# Patient Record
Sex: Female | Born: 1980
Health system: Southern US, Community
[De-identification: ages and names within clinical notes are randomized; demographics above are authoritative.]

## PROBLEM LIST (undated history)

## (undated) DIAGNOSIS — K30 Functional dyspepsia: Secondary | ICD-10-CM

## (undated) DIAGNOSIS — J45909 Unspecified asthma, uncomplicated: Secondary | ICD-10-CM

## (undated) DIAGNOSIS — E559 Vitamin D deficiency, unspecified: Secondary | ICD-10-CM

## (undated) DIAGNOSIS — S42309A Unspecified fracture of shaft of humerus, unspecified arm, initial encounter for closed fracture: Secondary | ICD-10-CM

## (undated) DIAGNOSIS — E039 Hypothyroidism, unspecified: Secondary | ICD-10-CM

## (undated) DIAGNOSIS — J329 Chronic sinusitis, unspecified: Secondary | ICD-10-CM

## (undated) DIAGNOSIS — J3089 Other allergic rhinitis: Secondary | ICD-10-CM

## (undated) DIAGNOSIS — M069 Rheumatoid arthritis, unspecified: Secondary | ICD-10-CM

## (undated) DIAGNOSIS — Z923 Personal history of irradiation: Secondary | ICD-10-CM

## (undated) DIAGNOSIS — S92909A Unspecified fracture of unspecified foot, initial encounter for closed fracture: Secondary | ICD-10-CM

## (undated) HISTORY — PX: ROOT CANAL: SHX2363

## (undated) HISTORY — PX: TOE SURGERY: SHX1073

## (undated) HISTORY — DX: Rheumatoid arthritis, unspecified: M06.9

---
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
## 2000-03-12 ENCOUNTER — Emergency Department (HOSPITAL_COMMUNITY): Admission: EM | Admit: 2000-03-12 | Discharge: 2000-03-12 | Payer: Self-pay | Admitting: Emergency Medicine

## 2000-07-07 ENCOUNTER — Emergency Department (HOSPITAL_COMMUNITY): Admission: EM | Admit: 2000-07-07 | Discharge: 2000-07-07 | Payer: Self-pay | Admitting: Emergency Medicine

## 2001-03-23 ENCOUNTER — Emergency Department (HOSPITAL_COMMUNITY): Admission: EM | Admit: 2001-03-23 | Discharge: 2001-03-23 | Payer: Self-pay | Admitting: Emergency Medicine

## 2001-03-23 ENCOUNTER — Encounter: Payer: Self-pay | Admitting: Emergency Medicine

## 2001-05-20 ENCOUNTER — Other Ambulatory Visit: Admission: RE | Admit: 2001-05-20 | Discharge: 2001-05-20 | Payer: Self-pay | Admitting: Family Medicine

## 2001-09-01 ENCOUNTER — Ambulatory Visit (HOSPITAL_COMMUNITY): Admission: RE | Admit: 2001-09-01 | Discharge: 2001-09-01 | Payer: Self-pay | Admitting: *Deleted

## 2001-09-01 ENCOUNTER — Encounter: Payer: Self-pay | Admitting: *Deleted

## 2001-10-22 ENCOUNTER — Inpatient Hospital Stay (HOSPITAL_COMMUNITY): Admission: AD | Admit: 2001-10-22 | Discharge: 2001-10-22 | Payer: Self-pay | Admitting: Obstetrics

## 2002-01-26 ENCOUNTER — Inpatient Hospital Stay (HOSPITAL_COMMUNITY): Admission: AD | Admit: 2002-01-26 | Discharge: 2002-01-28 | Payer: Self-pay | Admitting: Obstetrics and Gynecology

## 2002-03-05 ENCOUNTER — Emergency Department (HOSPITAL_COMMUNITY): Admission: EM | Admit: 2002-03-05 | Discharge: 2002-03-05 | Payer: Self-pay

## 2002-03-09 ENCOUNTER — Other Ambulatory Visit: Admission: RE | Admit: 2002-03-09 | Discharge: 2002-03-09 | Payer: Self-pay | Admitting: Obstetrics and Gynecology

## 2002-07-13 ENCOUNTER — Emergency Department (HOSPITAL_COMMUNITY): Admission: EM | Admit: 2002-07-13 | Discharge: 2002-07-13 | Payer: Self-pay | Admitting: Emergency Medicine

## 2002-12-23 ENCOUNTER — Encounter: Payer: Self-pay | Admitting: Family Medicine

## 2002-12-23 ENCOUNTER — Encounter: Admission: RE | Admit: 2002-12-23 | Discharge: 2002-12-23 | Payer: Self-pay | Admitting: Family Medicine

## 2006-06-19 ENCOUNTER — Emergency Department (HOSPITAL_COMMUNITY): Admission: EM | Admit: 2006-06-19 | Discharge: 2006-06-20 | Payer: Self-pay | Admitting: Emergency Medicine

## 2007-08-24 ENCOUNTER — Emergency Department (HOSPITAL_COMMUNITY): Admission: EM | Admit: 2007-08-24 | Discharge: 2007-08-24 | Payer: Self-pay | Admitting: Family Medicine

## 2010-08-18 ENCOUNTER — Ambulatory Visit (HOSPITAL_COMMUNITY)
Admission: RE | Admit: 2010-08-18 | Discharge: 2010-08-18 | Payer: Self-pay | Source: Home / Self Care | Attending: Nurse Practitioner | Admitting: Nurse Practitioner

## 2010-08-20 ENCOUNTER — Encounter: Payer: Self-pay | Admitting: Family Medicine

## 2010-12-15 NOTE — Discharge Summary (Signed)
[facility]  Patient:    Erika King, Erika King Visit Number: [id number] MRN: [id number]          Service Type: OBS Location: 910A [id number] [id number] Attending Physician:  Michaele Offer Dictated by:   Malachi Pro. Ambrose Mantle, M.D. Admit Date:  01/26/2002 Discharge Date: 01/28/2002                             Discharge Summary  HISTORY OF PRESENT ILLNESS:       The patient is a 30 year old black female para 0-0-1-0, gravida 2, [facility] January 29, 2002 by first-trimester ultrasound inconsistent with last menstrual period presented for induction of labor given very favorable cervix and term status.  Pregnancy complicated by family history of Huntingtons chorea, the patient tested negative; otherwise, uneventful prenatal course.  LABORATORY DATA:                  A positive blood group and type with a negative antibody, sickle cell negative, RPR nonreactive, rubella immune, hepatitis B surface antigen negative, HIV negative, triple screen normal, group B strep negative.  PAST OBSTETRICAL HISTORY:         Early abortion x1.  PAST MEDICAL HISTORY:             Negative.  PAST SURGICAL HISTORY:            Negative.  ALLERGIES:                        No known allergies.  HOSPITAL COURSE:                  On admission the patient was afebrile with normal vital signs.  Abdomen was consistent with a 7-pound infant.  Cervix was 3-4 cm, 80%, vertex, at -1.  Dr. Senaida Ores ruptured the membranes with clear fluid.  Pitocin was given.  The patient reached complete dilatation and pushed well with a spontaneous vaginal delivery of a vigorous female infant over an intact perineum.  The infant was 7 pounds 10 ounces.  Apgars of 9 at one and 9 at five minutes.  Placenta delivered spontaneously, three-vessel cord. Small periurethral laceration on the right was not repaired as it was hemostatic.  Blood loss about 350 cc.  Cervix and rectum were intact.  Blood pressures were  slightly elevated and they were followed; without treatment, they continued to be intermittently minimally elevated.  The patient had no headache or epigastric pain and she was considered ready for discharge on the second postpartum day.  DISCHARGE LABORATORY DATA:        Hemoglobin 10.9, hematocrit 32.6, white count 8100, platelet count 180,000.  Metabolic profile showed liver enzymes to be normal.  AST 25, ALT 15, bilirubin was 0.3, uric acid 5.2, RPR was nonreactive.  FINAL DIAGNOSIS:                  Intrauterine pregnancy at 39 weeks                                   delivered.  OPERATION:                        Spontaneous vertex delivery.  FINAL CONDITION:  Improved.  DISCHARGE INSTRUCTIONS:           Instructions include our regular discharge instruction booklet.  DISCHARGE MEDICATIONS:            Ibuprofen 600 mg (20 tablets) one every 6 hours as needed for pain is given at discharge.  FOLLOWUP:                         The patient is advised to return to the office in 6 weeks for followup examination. Dictated by:   Malachi Pro. Ambrose Mantle, M.D. Attending Physician:  Michaele Offer DD:  01/28/02 TD:  01/30/02 Job: [id number]

## 2013-12-31 ENCOUNTER — Ambulatory Visit (INDEPENDENT_AMBULATORY_CARE_PROVIDER_SITE_OTHER): Payer: Self-pay | Admitting: General Surgery

## 2015-01-25 ENCOUNTER — Encounter: Payer: Self-pay | Admitting: Obstetrics and Gynecology

## 2015-07-21 ENCOUNTER — Other Ambulatory Visit: Payer: Self-pay | Admitting: Nurse Practitioner

## 2015-07-21 DIAGNOSIS — R221 Localized swelling, mass and lump, neck: Secondary | ICD-10-CM

## 2015-08-12 ENCOUNTER — Ambulatory Visit
Admission: RE | Admit: 2015-08-12 | Discharge: 2015-08-12 | Disposition: A | Discharge status: Home / Self Care | Payer: Commercial Managed Care - HMO | Source: Ambulatory Visit | Attending: Internal Medicine | Admitting: Internal Medicine

## 2015-08-12 DIAGNOSIS — R221 Localized swelling, mass and lump, neck: Secondary | ICD-10-CM

## 2015-08-29 ENCOUNTER — Other Ambulatory Visit: Payer: Self-pay

## 2015-09-28 DIAGNOSIS — Z923 Personal history of irradiation: Secondary | ICD-10-CM

## 2015-09-28 HISTORY — DX: Personal history of irradiation: Z92.3

## 2015-09-30 ENCOUNTER — Other Ambulatory Visit (HOSPITAL_COMMUNITY): Payer: Self-pay | Admitting: Internal Medicine

## 2015-09-30 DIAGNOSIS — E0590 Thyrotoxicosis, unspecified without thyrotoxic crisis or storm: Secondary | ICD-10-CM

## 2015-10-04 ENCOUNTER — Encounter (HOSPITAL_COMMUNITY): Payer: Commercial Managed Care - HMO

## 2015-10-05 ENCOUNTER — Encounter (HOSPITAL_COMMUNITY): Payer: Commercial Managed Care - HMO

## 2015-10-11 ENCOUNTER — Other Ambulatory Visit (HOSPITAL_COMMUNITY): Payer: Self-pay | Admitting: Internal Medicine

## 2015-10-11 DIAGNOSIS — E0590 Thyrotoxicosis, unspecified without thyrotoxic crisis or storm: Secondary | ICD-10-CM

## 2015-10-18 ENCOUNTER — Encounter (HOSPITAL_COMMUNITY)
Admission: RE | Admit: 2015-10-18 | Discharge: 2015-10-18 | Disposition: A | Discharge status: Home / Self Care | Payer: 59 | Source: Ambulatory Visit | Attending: Internal Medicine | Admitting: Internal Medicine

## 2015-10-18 DIAGNOSIS — E0590 Thyrotoxicosis, unspecified without thyrotoxic crisis or storm: Secondary | ICD-10-CM | POA: Insufficient documentation

## 2015-10-18 MED ORDER — SODIUM IODIDE I 131 CAPSULE
14.3000 | Freq: Once | INTRAVENOUS | Status: AC | PRN
  Administered 2015-10-18: 14.3 via ORAL

## 2015-10-19 ENCOUNTER — Encounter (HOSPITAL_COMMUNITY)
Admission: RE | Admit: 2015-10-19 | Discharge: 2015-10-19 | Disposition: A | Discharge status: Home / Self Care | Payer: 59 | Source: Ambulatory Visit | Attending: Internal Medicine | Admitting: Internal Medicine

## 2015-10-19 DIAGNOSIS — E0590 Thyrotoxicosis, unspecified without thyrotoxic crisis or storm: Secondary | ICD-10-CM | POA: Diagnosis not present

## 2015-10-19 MED ORDER — SODIUM PERTECHNETATE TC 99M INJECTION
10.0000 | Freq: Once | INTRAVENOUS | Status: AC | PRN
  Administered 2015-10-19: 10 via INTRAVENOUS

## 2015-10-21 ENCOUNTER — Ambulatory Visit (HOSPITAL_COMMUNITY)
Admission: RE | Admit: 2015-10-21 | Discharge: 2015-10-21 | Disposition: A | Discharge status: Home / Self Care | Payer: 59 | Source: Ambulatory Visit | Attending: Internal Medicine | Admitting: Internal Medicine

## 2015-10-21 DIAGNOSIS — E0590 Thyrotoxicosis, unspecified without thyrotoxic crisis or storm: Secondary | ICD-10-CM | POA: Diagnosis not present

## 2015-10-21 DIAGNOSIS — E0500 Thyrotoxicosis with diffuse goiter without thyrotoxic crisis or storm: Secondary | ICD-10-CM | POA: Diagnosis not present

## 2015-10-21 LAB — HCG, SERUM, QUALITATIVE: Preg, Serum: NEGATIVE

## 2015-10-21 MED ORDER — SODIUM IODIDE I 131 CAPSULE
15.9000 | Freq: Once | INTRAVENOUS | Status: AC | PRN
  Administered 2015-10-21: 15.9 via ORAL

## 2015-12-08 ENCOUNTER — Ambulatory Visit: Payer: 59 | Admitting: Podiatry

## 2015-12-08 ENCOUNTER — Encounter: Payer: Self-pay | Admitting: Podiatry

## 2015-12-08 ENCOUNTER — Ambulatory Visit (INDEPENDENT_AMBULATORY_CARE_PROVIDER_SITE_OTHER): Payer: 59 | Admitting: Podiatry

## 2015-12-08 ENCOUNTER — Ambulatory Visit (INDEPENDENT_AMBULATORY_CARE_PROVIDER_SITE_OTHER): Payer: 59

## 2015-12-08 VITALS — BP 96/64 | HR 71 | Resp 16

## 2015-12-08 DIAGNOSIS — M79672 Pain in left foot: Secondary | ICD-10-CM

## 2015-12-08 DIAGNOSIS — M21621 Bunionette of right foot: Secondary | ICD-10-CM

## 2015-12-08 DIAGNOSIS — M2010 Hallux valgus (acquired), unspecified foot: Secondary | ICD-10-CM

## 2015-12-08 DIAGNOSIS — M79671 Pain in right foot: Secondary | ICD-10-CM

## 2015-12-08 DIAGNOSIS — M2040 Other hammer toe(s) (acquired), unspecified foot: Secondary | ICD-10-CM | POA: Diagnosis not present

## 2015-12-08 NOTE — Progress Notes (Signed)
   Subjective:    Patient ID: Erika King, female    DOB: August 28, 1980, 35 y.o.   MRN: [id number]  HPI  Chief Complaint  Patient presents with  . Foot Pain    1st MPJ bilateral, 3rd toe right, 5th toes bilateral - aching for several years, just worsening, developing corns, discolored and sometimes swollen, shoes are uncomfortable and rubbing       Review of Systems  All other systems reviewed and are negative.      Objective:   Physical Exam        Assessment & Plan:

## 2015-12-08 NOTE — Progress Notes (Signed)
Subjective:     Patient ID: Erika King, female   DOB: 1981-02-16, 35 y.o.   MRN: [id number]  HPI patient presents with significant bunion deformity bilateral hammertoe deformity of the third fourth and fifth digit right and fifth digit left with keratotic lesions that are painful and pain on the outside of the fifth metatarsal right over left. Also has deviation the big toe bilateral with keratotic lesion on the side that's painful. Patient states that she's tried trimming padding wider shoes without relief of symptoms and has known she's needed to have these fixed for a long time   Review of Systems  All other systems reviewed and are negative.      Objective:   Physical Exam  Constitutional: She is oriented to person, place, and time.  Cardiovascular: Intact distal pulses.   Musculoskeletal: Normal range of motion.  Neurological: She is oriented to person, place, and time.  Skin: Skin is warm.  Nursing note and vitals reviewed.  neurovascular status found to be intact with muscle strength adequate range of motion within normal limits. Patient's found to have structural bunion deformity bilateral with deviation the hallux against the second toe bilateral with keratotic lesion on the side of the big toe bilateral. There is a prominence of the fifth metatarsal head of both feet that's painful and keratotic lesions distal third and fourth digits right and proximal fifth digit bilateral. Patient's found to have good digital perfusion and is well oriented 3     Assessment:     Chronic structural deformities with HAV deformity hammertoe deformity and Taylor's bunion deformity right over left    Plan:     H&P and x-rays reviewed with patient. Today I discussed treatment options available with this and I've recommended surgical intervention for the long-term. She wants to get this done but needs to wait until November and at this time I reviewed with her Austin-type osteotomy possible  Akin osteotomy distal arthroplasty digit for right proximal arthroplasty digit 3 and 5 right and metatarsal osteotomy fifth right for the first foot. Patient wants this done will probably get the second foot done approximate 6 weeks later and is scheduled for consult prior to procedure  X-ray report indicated elevation of the 1 and 2 intermetatarsal angle of approximately 15 bilateral with elevation of the 45 intermetatarsal angle and abnormal digital positions of the lesser digits bilateral

## 2015-12-08 NOTE — Patient Instructions (Addendum)
Bunion (Hallux Valgus) A bony bump (protrusion) on the inside of the foot, at the base of the first toe, is called a bunion (hallux valgus). A bunion causes the first toe to angle toward the other toes. SYMPTOMS  1. A bony bump on the inside of the foot, causing an outward turning of the first toe. It may also overlap the second toe. 2. Thickening of the skin (callus) over the bony bump. 3. Fluid buildup under the callus. Fluid may become red, tender, and swollen (inflamed) with constant irritation or pressure. 4. Foot pain and stiffness. CAUSES  Many causes exist, including:  Inherited from your family (genetics).  Injury (trauma) forcing the first toe into a position in which it overlaps other toes.  Bunions are also associated with wearing shoes that have a narrow toe box (pointy shoes). RISK INCREASES WITH:  Family history of foot abnormalities, especially bunions.  Arthritis.  Narrow shoes, especially high heels. PREVENTION  Wear shoes with a wide toe box.  Avoid shoes with high heels.  Wear a small pad between the big toe and second toe.  Maintain proper conditioning:  Foot and ankle flexibility.  Muscle strength and endurance. PROGNOSIS  With proper treatment, bunions can typically be cured. Occasionally, surgery is required.  RELATED COMPLICATIONS   Infection of the bunion.  Arthritis of the first toe.  Risks of surgery, including infection, bleeding, injury to nerves (numb toe), recurrent bunion, overcorrection (toe points inward), arthritis of the big toe, big toe pointing upward, and bone not healing. TREATMENT  Treatment first consists of stopping the activities that aggravate the pain, taking pain medicines, and icing to reduce inflammation and pain. Wear shoes with a wide toe box. Shoes can be modified by a shoe repair person to relieve pressure on the bunion, especially if you cannot find shoes with a wide enough toe box. You may also place a pad with  the center cut out in your shoe, to reduce pressure on the bunion. Sometimes, an arch support (orthotic) may reduce pressure on the bunion and alleviate the symptoms. Stretching and strengthening exercises for the muscles of the foot may be useful. You may choose to wear a brace or pad at night to hold the big toe away from the second toe. If non-surgical treatments are not successful, surgery may be needed. Surgery involves removing the overgrown tissue and correcting the position of the first toe, by realigning the bones. Bunion surgery is typically performed on an outpatient basis, meaning you can go home the same day as surgery. The surgery may involve cutting the mid portion of the bone of the first toe, or just cutting and repairing (reconstructing) the ligaments and soft tissues around the first toe.  MEDICATION   If pain medicine is needed, nonsteroidal anti-inflammatory medicines, such as aspirin and ibuprofen, or other minor pain relievers, such as acetaminophen, are often recommended.  Do not take pain medicine for 7 days before surgery.  Prescription pain relievers are usually only prescribed after surgery. Use only as directed and only as much as you need.  Ointments applied to the skin may be helpful. HEAT AND COLD  Cold treatment (icing) relieves pain and reduces inflammation. Cold treatment should be applied for 10 to 15 minutes every 2 to 3 hours for inflammation and pain and immediately after any activity that aggravates your symptoms. Use ice packs or an ice massage.  Heat treatment may be used prior to performing the stretching and strengthening activities prescribed by  your caregiver, physical therapist, or athletic trainer. Use a heat pack or a warm soak. SEEK MEDICAL CARE IF:   Symptoms get worse or do not improve in 2 weeks, despite treatment.  After surgery, you develop fever, increasing pain, redness, swelling, drainage of fluids, bleeding, or increasing warmth around the  surgical area.  New, unexplained symptoms develop. (Drugs used in treatment may produce side effects.)   This information is not intended to replace advice given to you by your health care provider. Make sure you discuss any questions you have with your health care provider.   Document Released: 07/16/2005 Document Revised: 10/08/2011 Document Reviewed: 10/28/2008 Elsevier Interactive Patient Education 2016 [place] toes is a condition in which one or more of your toes is permanently flexed. CAUSES  This happens when a muscle imbalance or abnormal bone length makes your small toes buckle. This causes the toe joint to contract and the strong cord-like bands that attach muscles to the bones (tendons) in your toes to shorten.  SIGNS AND SYMPTOMS  Common symptoms of flexible hammer toes include:  5. A buildup of skin cells (corns). Corns occur where boney bumps come in frequent contact with hard surfaces. For example, where your shoes press and rub. 6. Irritation. 7. Inflammation. 8. Pain. 9. Limited motion in your toes. DIAGNOSIS  Hammer toes are diagnosed through a physical exam of your toes. During the exam, your health care provider may try to reproduce your symptoms by manipulating your foot. Often, X-ray exams are done to determine the degree of deformity and to make sure that the cause is not a fracture.  TREATMENT  Hammer toes can be treated with corrective surgery. There are several types of surgical procedures that can treat hammer toes. The most common procedures include:  Arthroplasty--A portion of the joint is surgically removed and your toe is straightened. The gap fills in with fibrous tissue. This procedure helps treat pain and deformity and helps restore function.  Fusion--Cartilage between the two bones of the affected joint is taken out and the bones fuse together into one longer bone. This helps keep your toe stable and reduces pain but leaves  your toe stiff, yet straight.  Implantation--A portion of your bone is removed and replaced with an implant to restore motion.  Flexor tendon transfers--This procedure repositions the tendons that curl the toes down (flexor tendons). This may be done to release the deforming force that causes your toe to buckle. Several of these procedures require fixing your toe with a pin that is visible at the tip of your toe. The pin keeps the toe straight during healing. Your health care provider will remove the pin usually within 4-8 weeks after the procedure.    This information is not intended to replace advice given to you by your health care provider. Make sure you discuss any questions you have with your health care provider.   Document Released: 07/13/2000 Document Revised: 07/21/2013 Document Reviewed: 03/23/2013 Elsevier Interactive Patient Education [employer].

## 2016-05-28 ENCOUNTER — Ambulatory Visit: Payer: 59 | Admitting: Podiatry

## 2016-06-07 ENCOUNTER — Telehealth: Payer: Self-pay | Admitting: *Deleted

## 2016-06-07 NOTE — Telephone Encounter (Signed)
"  I am going to have to post-pone my surgery scheduled for 06/19/2016.  I am having fibroid cysts and hernia repair done.  I will call back to reschedule in January.  I called and informed Caren Griffins at [facility] about the cancellation.

## 2016-06-08 ENCOUNTER — Ambulatory Visit: Payer: 59 | Admitting: Podiatry

## 2016-06-13 ENCOUNTER — Other Ambulatory Visit: Payer: Self-pay | Admitting: Obstetrics and Gynecology

## 2016-06-29 ENCOUNTER — Other Ambulatory Visit: Payer: Self-pay

## 2016-07-12 ENCOUNTER — Other Ambulatory Visit: Payer: Self-pay | Admitting: *Deleted

## 2016-07-12 NOTE — Patient Instructions (Signed)
Your procedure is scheduled on:  Tuesday, Dec. 19, 2017  Enter through the [employer] [facility] at:  7:00 AM  Pick up the phone at the desk and dial [phone].  Call this number if you have problems the morning of surgery: [phone].  Remember: Do NOT eat food or drink after:  Midnight Monday  Take these medicines the morning of surgery with a SIP OF WATER:  Levothyroxine  Bring Asthma Inhaler day of surgey  Stop ALL herbal medications at this time   Do NOT wear jewelry (body piercing), metal hair clips/bobby pins, make-up, or nail polish. Do NOT wear lotions, powders, or perfumes.  You may wear deodorant. Do NOT shave for 48 hours prior to surgery. Do NOT bring valuables to the hospital. Contacts, dentures, or bridgework may not be worn into surgery.  Leave suitcase in car.  After surgery it may be brought to your room.  For patients admitted to the hospital, checkout time is 11:00 AM the day of discharge.

## 2016-07-13 ENCOUNTER — Encounter (HOSPITAL_COMMUNITY): Payer: Self-pay

## 2016-07-13 ENCOUNTER — Encounter (HOSPITAL_COMMUNITY)
Admission: RE | Admit: 2016-07-13 | Discharge: 2016-07-13 | Disposition: A | Discharge status: Home / Self Care | Payer: 59 | Source: Ambulatory Visit | Attending: Obstetrics and Gynecology | Admitting: Obstetrics and Gynecology

## 2016-07-13 ENCOUNTER — Other Ambulatory Visit: Payer: Self-pay | Admitting: General Surgery

## 2016-07-13 DIAGNOSIS — Z01818 Encounter for other preprocedural examination: Secondary | ICD-10-CM | POA: Insufficient documentation

## 2016-07-13 HISTORY — DX: Unspecified fracture of unspecified foot, initial encounter for closed fracture: S92.909A

## 2016-07-13 HISTORY — DX: Unspecified asthma, uncomplicated: J45.909

## 2016-07-13 HISTORY — DX: Hypothyroidism, unspecified: E03.9

## 2016-07-13 HISTORY — DX: Chronic sinusitis, unspecified: J32.9

## 2016-07-13 HISTORY — DX: Vitamin D deficiency, unspecified: E55.9

## 2016-07-13 HISTORY — DX: Personal history of irradiation: Z92.3

## 2016-07-13 HISTORY — DX: Unspecified fracture of shaft of humerus, unspecified arm, initial encounter for closed fracture: S42.309A

## 2016-07-13 HISTORY — DX: Functional dyspepsia: K30

## 2016-07-13 HISTORY — DX: Other allergic rhinitis: J30.89

## 2016-07-13 LAB — BASIC METABOLIC PANEL
ANION GAP: 6 (ref 5–15)
BUN: 10 mg/dL (ref 6–20)
CHLORIDE: 105 mmol/L (ref 101–111)
CO2: 23 mmol/L (ref 22–32)
Calcium: 9 mg/dL (ref 8.9–10.3)
Creatinine, Ser: 0.78 mg/dL (ref 0.44–1.00)
Glucose, Bld: 83 mg/dL (ref 65–99)
POTASSIUM: 3.8 mmol/L (ref 3.5–5.1)
SODIUM: 134 mmol/L — AB (ref 135–145)

## 2016-07-13 LAB — CBC
HEMATOCRIT: 35.8 % — AB (ref 36.0–46.0)
Hemoglobin: 12.4 g/dL (ref 12.0–15.0)
MCH: 32.1 pg (ref 26.0–34.0)
MCHC: 34.6 g/dL (ref 30.0–36.0)
MCV: 92.7 fL (ref 78.0–100.0)
Platelets: 235 10*3/uL (ref 150–400)
RBC: 3.86 MIL/uL — AB (ref 3.87–5.11)
RDW: 13.1 % (ref 11.5–15.5)
WBC: 5.9 10*3/uL (ref 4.0–10.5)

## 2016-07-13 LAB — ABO/RH: ABO/RH(D): A POS

## 2016-07-13 LAB — TYPE AND SCREEN
ABO/RH(D): A POS
Antibody Screen: NEGATIVE

## 2016-07-13 NOTE — H&P (Signed)
Erika King is a 35 y.o. female, P: 1-0-1-1 who  presents for myomectomy because of symptomatic uterine fibroids. The patient reports increasing urinary frequency and slowed bowel evacuation over the past year with a noticeable increase in abdominal girth. She denies any incontinence, dyspareunia, inter-menstrual bleeding or pelvic pain. Her monthly menstrual periods lasts for 4 days with pad change every 2-3 hours but no clotting. She does experience cramping that she rates as 6/10 on a 10 point pain scale but finds relief with a heating pad and Ibuprofen 600 mg. A pelvic ultrasound in August 2017 showed:  uterus: 9.48 x 6.46 x 5.64 cm, endometrium: 6.2 mm; #4 sub-serosal fibroids: posterior-right-3.68 cm, anterior right-6.70 cm, anterior left-8.01 cm and posterior left-9.75 cm. A review of both medical and surgical management options for fibroids was given to the patient, however, she would like to proceed to myomectomy.   Past Medical History  OB History: G: 2   P: 1-0-1-1;  SVB 2003  GYN History: menarche: 35 YO    LMP: 06/21/16    Contracepton no method  The patient denies history of sexually transmitted disease.  Denies history of abnormal PAP smear.  Last PAP smear: 2917-normal  Medical History: Rheumatoid Arthritis, Thyroid Disease, Acne, Asthma and Vitamin D Deficiency  Surgical History:  None Denies  history of blood transfusions  Family History: Thyroid Disease, Asthma, Diabetes Mellitus, Lung Cancer, Hypertension, Anxiety, Depression, Huntington's Chorea, Bipolar Disorder, Heart Disease, Rheumatoid Arthritis, Prostate Cancer and Osteoarthritis  Social History: Single and employed as an Librarian, academic;  Denies tobacco use and rarely uses alcohol   Medications:  Enbrel 50 mg/mL every other month Ventolin HFA 90 mcg Inhaler 2 puffs every 6 hours prn Flonase #2 sprays per nostril daily prn Levothyroxine 137 mcg daily Vitamin D 5,000 IU twice a week Montelukast 10  mg daily   No Known Allergies   Denies sensitivity to peanuts, shellfish, soy, latex or adhesives.   ROS: Admits to glasses and seasonal nasal congestion due to allergies but denies headache, vision changes, nasal congestion, dysphagia, tinnitus, dizziness, hoarseness, cough,  chest pain, shortness of breath, nausea, vomiting, diarrhea,constipation,  urinary urgency  dysuria, hematuria, vaginitis symptoms, pelvic pain, swelling of joints,easy bruising,  myalgias, arthralgias, skin rashes, unexplained weight loss and except as is mentioned in the history of present illness, patient's review of systems is otherwise negative.   Physical Exam  Bp: 98/64   P: 80 bpm    R: 16    Weight: 183 lbs.  Height: 5' 5.5"  BMI: 30  Neck: supple without masses or thyromegaly Lungs: clear to auscultation Heart: regular rate and rhythm Abdomen: soft, non-tender and no organomegaly Pelvic:EGBUS- wnl; vagina-normal rugae;  size, cervix without lesions or motion tenderness; adnexae-no tenderness or masses, though partially obscured by mass effect of uterus. Extremities:  no clubbing, cyanosis or edema   Assesment: Symptomatic Uterine Fibroids   Disposition:  A discussion was held with patient regarding the indication for her procedure(s) along with the risks, which include but are not limited to: reaction to anesthesia, damage to adjacent organs, infection and excessive bleeding. The patient verbalized understanding of these risks and has consented to proceed with an Abdominal Myomectomy at [facility] on July 17, 2016 @ 8:30 a.m.n  Dr. Dalbert Batman will follow me doing an umbilical hernia repair.   CSN# [id number]   [place] J. Florene Glen, PA-C  for Dr. Harvie Bridge. Mancel Bale

## 2016-07-16 NOTE — H&P (Signed)
Erika King Location: [facility] Patient #: [id number] DOB: Mar 13, 1981 Single / Language: Erika King / Race: Black or African American Female       History of Present Illness  The patient is a 35 year old female who presents with an umbilical hernia. 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 She has a symptomatic umbilical hernia. She initially saw Dr. Clarise Cruz.  Dr. Everett Graff plans for elective uterine myomectomy through a Pfannenstiel incision, and the patient desires that the umbilical hernia be repaired at the same time. Dr. Clarise Cruz was unavailable for that and I have agreed to step in and performed the umbilical hernia repair, which is scheduled to weeks from now.  The patient has noted a bulge in her umbilicus for 14 years, since her pregnancy. She's been to the emergency room twice with temporary incarceration but has never had nausea or vomiting. No prior surgery of the umbilicus. No history of inguinal hernia or inguinal pain.  Takes thyroid hormone, otherwise healthy. Gravida 1, para 1.  I discussed the indications, details, techniques, and numerous risk of the surgery with her. I told her I would probably reprep and redrape to do the surgery. If the uterine surgery as a class I surgery then we will consider synthetic mesh. I might do a primary repair after discussion regarding the conduct of the uterine surgery with Dr. Mancel Bale. She is aware the risks of bleeding, infection, recurrence of the hernia, nerve damage with chronic pain, injury to the intestinal organs with major reconstructive surgery, although unlikely. She understands all of these issues. All of her questions were answered. She agrees with this plan.   Allergies  No Known Drug Allergies   Medication History  Clotrimazole (1% Cream, External) Active. Levothyroxine Sodium (137MCG Tablet, Oral) Active. Fluticasone Propionate (50MCG/ACT  Suspension, Nasal) Active. Vitamin D (Cholecalciferol) (Oral) Specific dose unknown - Active. Multiple Vitamin (Oral) Active. Enbrel (25MG /0.5ML Soln Pref Syr, Subcutaneous) Active. Medications Reconciled  Vitals  Weight: 184 lb Height: 65in Body Surface Area: 1.91 m Body Mass Index: 30.62 kg/m  Temp.: 38F(Temporal)  Pulse: 81 (Regular)  BP: 126/74 (Sitting, Left Arm, Standard)   Physical Exam General Mental Status-Alert. General Appearance-Not in acute distress. Build & Nutrition-Well nourished. Posture-Normal posture. Gait-Normal.  Head and Neck Head-normocephalic, atraumatic with no lesions or palpable masses. Trachea-midline. Thyroid Gland Characteristics - normal size and consistency and no palpable nodules.  Chest and Lung Exam Chest and lung exam reveals -on auscultation, normal breath sounds, no adventitious sounds and normal vocal resonance.  Cardiovascular Cardiovascular examination reveals -normal heart sounds, regular rate and rhythm with no murmurs and femoral artery auscultation bilaterally reveals normal pulses, no bruits, no thrills.  Abdomen Inspection Inspection of the abdomen reveals - No Hernias. Palpation/Percussion Palpation and Percussion of the abdomen reveal - Soft, Non Tender, No Rigidity (guarding), No hepatosplenomegaly and No Palpable abdominal masses. Note: Umbilical hernia. The hernia sac is about the size of a golf ball or a little smaller. It is reducible. Old scars from umbilical jewelry. The defect is perhaps smaller. No evidence of inguinal hernia or inguinal mass. No organomegaly. Abdomen is soft and nontender. Muscle tone is good   Neurologic Neurologic evaluation reveals -alert and oriented x 3 with no impairment of recent or remote memory, normal attention span and ability to concentrate, normal sensation and normal coordination.  Musculoskeletal Normal Exam - Bilateral-Upper Extremity  Strength Normal and Lower Extremity Strength Normal.    Assessment &  Plan  UMBILICAL HERNIA WITHOUT OBSTRUCTION OR GANGRENE (K42.9)   You have a small to medium size umbilical hernia. You have been to the emergency room twice with temporary incarcerations You're scheduled for surgery in 2 weeks.  Dr. Everett Graff will perform uterine myomectomy through a transverse incision in the lower abdomen. After she finishes her surgery I will perform umbilical hernia repair, possibly with mesh, through a transverse incision below your umbilicus I suspect you will need to spend one night in the hospital, but I will discuss with Dr. Mancel Bale We have discussed the indications, techniques, and risks of the surgery Please read the information booklet that we reviewed  UTERINE LEIOMYOMA, UNSPECIFIED LOCATION (D25.9)   Edsel Petrin. Dalbert Batman, M.D., [facility] General and Minimally invasive Surgery Breast and Colorectal Surgery Office:   [phone] Pager:   [phone]

## 2016-07-17 ENCOUNTER — Encounter (HOSPITAL_COMMUNITY): Payer: Self-pay

## 2016-07-17 ENCOUNTER — Inpatient Hospital Stay (HOSPITAL_COMMUNITY)
Admission: RE | Admit: 2016-07-17 | Discharge: 2016-07-20 | DRG: 742 | Disposition: A | Discharge status: Home / Self Care | Payer: 59 | Source: Ambulatory Visit | Attending: Obstetrics and Gynecology | Admitting: Obstetrics and Gynecology

## 2016-07-17 ENCOUNTER — Inpatient Hospital Stay (HOSPITAL_COMMUNITY): Payer: 59 | Admitting: Anesthesiology

## 2016-07-17 ENCOUNTER — Encounter (HOSPITAL_COMMUNITY)
Admission: RE | Disposition: A | Discharge status: Home / Self Care | Payer: Self-pay | Source: Ambulatory Visit | Attending: Obstetrics and Gynecology

## 2016-07-17 DIAGNOSIS — E039 Hypothyroidism, unspecified: Secondary | ICD-10-CM | POA: Diagnosis present

## 2016-07-17 DIAGNOSIS — J45909 Unspecified asthma, uncomplicated: Secondary | ICD-10-CM | POA: Diagnosis present

## 2016-07-17 DIAGNOSIS — D259 Leiomyoma of uterus, unspecified: Secondary | ICD-10-CM | POA: Diagnosis present

## 2016-07-17 DIAGNOSIS — R339 Retention of urine, unspecified: Secondary | ICD-10-CM | POA: Diagnosis not present

## 2016-07-17 DIAGNOSIS — K420 Umbilical hernia with obstruction, without gangrene: Secondary | ICD-10-CM | POA: Diagnosis present

## 2016-07-17 DIAGNOSIS — D252 Subserosal leiomyoma of uterus: Secondary | ICD-10-CM | POA: Diagnosis present

## 2016-07-17 DIAGNOSIS — M069 Rheumatoid arthritis, unspecified: Secondary | ICD-10-CM | POA: Diagnosis present

## 2016-07-17 HISTORY — PX: UMBILICAL HERNIA REPAIR: SHX196

## 2016-07-17 HISTORY — PX: MYOMECTOMY: SHX85

## 2016-07-17 LAB — CBC
HCT: 27.6 % — ABNORMAL LOW (ref 36.0–46.0)
HEMOGLOBIN: 9.5 g/dL — AB (ref 12.0–15.0)
MCH: 32.1 pg (ref 26.0–34.0)
MCHC: 34.4 g/dL (ref 30.0–36.0)
MCV: 93.2 fL (ref 78.0–100.0)
Platelets: 185 10*3/uL (ref 150–400)
RBC: 2.96 MIL/uL — AB (ref 3.87–5.11)
RDW: 13 % (ref 11.5–15.5)
WBC: 13.7 10*3/uL — AB (ref 4.0–10.5)

## 2016-07-17 LAB — PREGNANCY, URINE: PREG TEST UR: NEGATIVE

## 2016-07-17 SURGERY — MYOMECTOMY, ABDOMINAL APPROACH
Anesthesia: General | Site: Abdomen

## 2016-07-17 MED ORDER — CEFAZOLIN SODIUM-DEXTROSE 2-4 GM/100ML-% IV SOLN
2.0000 g | INTRAVENOUS | Status: AC
  Administered 2016-07-17 (×2): 2 g via INTRAVENOUS

## 2016-07-17 MED ORDER — SODIUM CHLORIDE 0.9 % IJ SOLN
INTRAMUSCULAR | Status: AC
  Filled 2016-07-17: qty 50

## 2016-07-17 MED ORDER — VASOPRESSIN 20 UNIT/ML IV SOLN
INTRAVENOUS | Status: AC
  Filled 2016-07-17: qty 1

## 2016-07-17 MED ORDER — HYDROMORPHONE HCL 1 MG/ML IJ SOLN
INTRAMUSCULAR | Status: AC
  Filled 2016-07-17: qty 1

## 2016-07-17 MED ORDER — HYDROMORPHONE HCL 1 MG/ML IJ SOLN
INTRAMUSCULAR | Status: DC | PRN
Start: 2016-07-17 — End: 2016-07-17
  Administered 2016-07-17: 1 mg via INTRAVENOUS

## 2016-07-17 MED ORDER — 0.9 % SODIUM CHLORIDE (POUR BTL) OPTIME
TOPICAL | Status: DC | PRN
Start: 2016-07-17 — End: 2016-07-17
  Administered 2016-07-17 (×2): 1000 mL

## 2016-07-17 MED ORDER — BUPIVACAINE-EPINEPHRINE (PF) 0.5% -1:200000 IJ SOLN
INTRAMUSCULAR | Status: AC
  Filled 2016-07-17: qty 30

## 2016-07-17 MED ORDER — PROPOFOL 10 MG/ML IV BOLUS
INTRAVENOUS | Status: AC
  Filled 2016-07-17: qty 20

## 2016-07-17 MED ORDER — SCOPOLAMINE 1 MG/3DAYS TD PT72
1.0000 | MEDICATED_PATCH | Freq: Once | TRANSDERMAL | Status: DC
  Administered 2016-07-17: 1.5 mg via TRANSDERMAL

## 2016-07-17 MED ORDER — SODIUM CHLORIDE 0.9% FLUSH: 9.0000 mL | INTRAVENOUS | Status: DC | PRN

## 2016-07-17 MED ORDER — CHLORHEXIDINE GLUCONATE CLOTH 2 % EX PADS: 6.0000 | MEDICATED_PAD | Freq: Once | CUTANEOUS | Status: DC

## 2016-07-17 MED ORDER — ALBUTEROL SULFATE (2.5 MG/3ML) 0.083% IN NEBU: 3.0000 mL | INHALATION_SOLUTION | Freq: Four times a day (QID) | RESPIRATORY_TRACT | Status: DC | PRN

## 2016-07-17 MED ORDER — KETOROLAC TROMETHAMINE 30 MG/ML IJ SOLN
30.0000 mg | Freq: Four times a day (QID) | INTRAMUSCULAR | Status: AC
  Administered 2016-07-18 (×2): 30 mg via INTRAVENOUS
  Filled 2016-07-17 (×2): qty 1

## 2016-07-17 MED ORDER — LEVOTHYROXINE SODIUM 137 MCG PO TABS
137.0000 ug | ORAL_TABLET | Freq: Every day | ORAL | Status: DC
  Administered 2016-07-18 – 2016-07-20 (×3): 137 ug via ORAL
  Filled 2016-07-17 (×5): qty 1

## 2016-07-17 MED ORDER — IBUPROFEN 600 MG PO TABS
600.0000 mg | ORAL_TABLET | Freq: Four times a day (QID) | ORAL | Status: DC | PRN
  Administered 2016-07-18 – 2016-07-20 (×6): 600 mg via ORAL
  Filled 2016-07-17 (×8): qty 1

## 2016-07-17 MED ORDER — LIDOCAINE HCL (CARDIAC) 20 MG/ML IV SOLN
INTRAVENOUS | Status: DC | PRN
  Administered 2016-07-17: 40 mg via INTRAVENOUS

## 2016-07-17 MED ORDER — CEFAZOLIN SODIUM-DEXTROSE 2-4 GM/100ML-% IV SOLN
2.0000 g | Freq: Four times a day (QID) | INTRAVENOUS | Status: AC
  Administered 2016-07-17 – 2016-07-18 (×3): 2 g via INTRAVENOUS
  Filled 2016-07-17 (×5): qty 100

## 2016-07-17 MED ORDER — ONDANSETRON HCL 4 MG/2ML IJ SOLN
INTRAMUSCULAR | Status: AC
  Filled 2016-07-17: qty 2

## 2016-07-17 MED ORDER — FENTANYL CITRATE (PF) 250 MCG/5ML IJ SOLN
INTRAMUSCULAR | Status: AC
  Filled 2016-07-17: qty 5

## 2016-07-17 MED ORDER — NALOXONE HCL 0.4 MG/ML IJ SOLN: 0.4000 mg | INTRAMUSCULAR | Status: DC | PRN

## 2016-07-17 MED ORDER — MENTHOL 3 MG MT LOZG: 1.0000 | LOZENGE | OROMUCOSAL | Status: DC | PRN

## 2016-07-17 MED ORDER — DEXAMETHASONE SODIUM PHOSPHATE 10 MG/ML IJ SOLN
INTRAMUSCULAR | Status: DC | PRN
  Administered 2016-07-17: 10 mg via INTRAVENOUS

## 2016-07-17 MED ORDER — LACTATED RINGERS IV SOLN
INTRAVENOUS | Status: DC
  Administered 2016-07-17 – 2016-07-18 (×3): via INTRAVENOUS

## 2016-07-17 MED ORDER — KETOROLAC TROMETHAMINE 30 MG/ML IJ SOLN
INTRAMUSCULAR | Status: DC | PRN
Start: 2016-07-17 — End: 2016-07-17
  Administered 2016-07-17: 30 mg via INTRAVENOUS

## 2016-07-17 MED ORDER — ONDANSETRON HCL 4 MG PO TABS: 4.0000 mg | ORAL_TABLET | Freq: Three times a day (TID) | ORAL | Status: DC | PRN

## 2016-07-17 MED ORDER — ONDANSETRON HCL 4 MG/2ML IJ SOLN: 4.0000 mg | Freq: Four times a day (QID) | INTRAMUSCULAR | Status: DC | PRN

## 2016-07-17 MED ORDER — HYDROMORPHONE HCL 1 MG/ML IJ SOLN
0.2500 mg | INTRAMUSCULAR | Status: DC | PRN
  Administered 2016-07-17 (×3): 0.25 mg via INTRAVENOUS

## 2016-07-17 MED ORDER — OXYCODONE-ACETAMINOPHEN 5-325 MG PO TABS
1.0000 | ORAL_TABLET | ORAL | Status: DC | PRN
  Administered 2016-07-18: 2 via ORAL
  Administered 2016-07-18 (×2): 1 via ORAL
  Administered 2016-07-19 (×3): 2 via ORAL
  Administered 2016-07-19: 1 via ORAL
  Administered 2016-07-20: 2 via ORAL
  Filled 2016-07-17 (×2): qty 2
  Filled 2016-07-17 (×3): qty 1
  Filled 2016-07-17 (×4): qty 2
  Filled 2016-07-17: qty 1

## 2016-07-17 MED ORDER — DOCUSATE SODIUM 100 MG PO CAPS
100.0000 mg | ORAL_CAPSULE | Freq: Two times a day (BID) | ORAL | Status: DC
  Administered 2016-07-18 – 2016-07-20 (×5): 100 mg via ORAL
  Filled 2016-07-17 (×5): qty 1

## 2016-07-17 MED ORDER — CEFAZOLIN SODIUM-DEXTROSE 2-4 GM/100ML-% IV SOLN
INTRAVENOUS | Status: AC
  Filled 2016-07-17: qty 100

## 2016-07-17 MED ORDER — NEOSTIGMINE METHYLSULFATE 10 MG/10ML IV SOLN
INTRAVENOUS | Status: DC | PRN
  Administered 2016-07-17: 2.5 mg via INTRAVENOUS

## 2016-07-17 MED ORDER — MIDAZOLAM HCL 2 MG/2ML IJ SOLN
INTRAMUSCULAR | Status: AC
  Filled 2016-07-17: qty 2

## 2016-07-17 MED ORDER — HYDROMORPHONE 1 MG/ML IV SOLN
INTRAVENOUS | Status: DC
  Administered 2016-07-17: 2.4 mL via INTRAVENOUS
  Administered 2016-07-17: 3.6 mg via INTRAVENOUS
  Administered 2016-07-17: 15:00:00 via INTRAVENOUS
  Administered 2016-07-18: 1.5 mg via INTRAVENOUS
  Administered 2016-07-18: 2.5 mg via INTRAVENOUS
  Administered 2016-07-18: 3.6 mg via INTRAVENOUS
  Administered 2016-07-18: 0.6 mg via INTRAVENOUS
  Filled 2016-07-17: qty 25

## 2016-07-17 MED ORDER — ONDANSETRON HCL 4 MG/2ML IJ SOLN
4.0000 mg | Freq: Four times a day (QID) | INTRAMUSCULAR | Status: DC | PRN
  Administered 2016-07-18: 4 mg via INTRAVENOUS
  Filled 2016-07-17: qty 2

## 2016-07-17 MED ORDER — MIDAZOLAM HCL 5 MG/5ML IJ SOLN
INTRAMUSCULAR | Status: DC | PRN
  Administered 2016-07-17: 2 mg via INTRAVENOUS

## 2016-07-17 MED ORDER — KETOROLAC TROMETHAMINE 30 MG/ML IJ SOLN
INTRAMUSCULAR | Status: AC
  Filled 2016-07-17: qty 1

## 2016-07-17 MED ORDER — GLYCOPYRROLATE 0.2 MG/ML IJ SOLN
INTRAMUSCULAR | Status: DC | PRN
  Administered 2016-07-17: .5 mg via INTRAVENOUS

## 2016-07-17 MED ORDER — PROPOFOL 10 MG/ML IV BOLUS
INTRAVENOUS | Status: DC | PRN
  Administered 2016-07-17: 150 mg via INTRAVENOUS

## 2016-07-17 MED ORDER — DIPHENHYDRAMINE HCL 12.5 MG/5ML PO ELIX: 12.5000 mg | ORAL_SOLUTION | Freq: Four times a day (QID) | ORAL | Status: DC | PRN

## 2016-07-17 MED ORDER — VASOPRESSIN 20 UNIT/ML IV SOLN
INTRAVENOUS | Status: DC | PRN
  Administered 2016-07-17: 46 mL via INTRAMUSCULAR

## 2016-07-17 MED ORDER — OXYCODONE HCL 5 MG PO TABS: 5.0000 mg | ORAL_TABLET | Freq: Once | ORAL | Status: DC | PRN

## 2016-07-17 MED ORDER — DIPHENHYDRAMINE HCL 50 MG/ML IJ SOLN: 12.5000 mg | Freq: Four times a day (QID) | INTRAMUSCULAR | Status: DC | PRN

## 2016-07-17 MED ORDER — CEFAZOLIN SODIUM-DEXTROSE 2-4 GM/100ML-% IV SOLN: 2.0000 g | INTRAVENOUS | Status: DC

## 2016-07-17 MED ORDER — FENTANYL CITRATE (PF) 250 MCG/5ML IJ SOLN
INTRAMUSCULAR | Status: DC | PRN
  Administered 2016-07-17: 100 ug via INTRAVENOUS
  Administered 2016-07-17 (×5): 50 ug via INTRAVENOUS
  Administered 2016-07-17: 100 ug via INTRAVENOUS
  Administered 2016-07-17: 50 ug via INTRAVENOUS

## 2016-07-17 MED ORDER — BUPIVACAINE-EPINEPHRINE 0.5% -1:200000 IJ SOLN
INTRAMUSCULAR | Status: DC | PRN
Start: 2016-07-17 — End: 2016-07-17
  Administered 2016-07-17: 10 mL

## 2016-07-17 MED ORDER — ROCURONIUM BROMIDE 100 MG/10ML IV SOLN
INTRAVENOUS | Status: DC | PRN
  Administered 2016-07-17 (×4): 10 mg via INTRAVENOUS
  Administered 2016-07-17: 50 mg via INTRAVENOUS
  Administered 2016-07-17: 10 mg via INTRAVENOUS

## 2016-07-17 MED ORDER — LIDOCAINE HCL (CARDIAC) 20 MG/ML IV SOLN
INTRAVENOUS | Status: AC
  Filled 2016-07-17: qty 5

## 2016-07-17 MED ORDER — OXYCODONE HCL 5 MG/5ML PO SOLN: 5.0000 mg | Freq: Once | ORAL | Status: DC | PRN

## 2016-07-17 MED ORDER — ONDANSETRON HCL 4 MG/2ML IJ SOLN
INTRAMUSCULAR | Status: DC | PRN
  Administered 2016-07-17: 4 mg via INTRAVENOUS

## 2016-07-17 MED ORDER — DEXAMETHASONE SODIUM PHOSPHATE 10 MG/ML IJ SOLN
INTRAMUSCULAR | Status: AC
  Filled 2016-07-17: qty 1

## 2016-07-17 MED ORDER — LACTATED RINGERS IV SOLN
INTRAVENOUS | Status: DC
  Administered 2016-07-17 (×3): via INTRAVENOUS
  Administered 2016-07-17: 125 mL/h via INTRAVENOUS
  Administered 2016-07-17: 10:00:00 via INTRAVENOUS

## 2016-07-17 SURGICAL SUPPLY — 74 items
ADH SKN CLS APL DERMABOND .7 (GAUZE/BANDAGES/DRESSINGS)
APL SKNCLS STERI-STRIP NONHPOA (GAUZE/BANDAGES/DRESSINGS) ×1
APPLICATOR COTTON TIP 6IN STRL (MISCELLANEOUS) IMPLANT
BARRIER ADHS 3X4 INTERCEED (GAUZE/BANDAGES/DRESSINGS) ×2 IMPLANT
BENZOIN TINCTURE PRP APPL 2/3 (GAUZE/BANDAGES/DRESSINGS) ×2 IMPLANT
BRR ADH 4X3 ABS CNTRL BYND (GAUZE/BANDAGES/DRESSINGS) ×1
CANISTER SUCT 3000ML (MISCELLANEOUS) ×3 IMPLANT
CLOSURE WOUND 1/2 X4 (GAUZE/BANDAGES/DRESSINGS) ×1
CLOTH BEACON ORANGE TIMEOUT ST (SAFETY) ×6 IMPLANT
CONT PATH 16OZ SNAP LID 3702 (MISCELLANEOUS) ×3 IMPLANT
DECANTER SPIKE VIAL GLASS SM (MISCELLANEOUS) ×3 IMPLANT
DERMABOND ADVANCED (GAUZE/BANDAGES/DRESSINGS)
DERMABOND ADVANCED .7 DNX12 (GAUZE/BANDAGES/DRESSINGS) IMPLANT
DRAIN JACKSON PRT FLT 7MM (DRAIN) IMPLANT
DRSG OPSITE POSTOP 4X10 (GAUZE/BANDAGES/DRESSINGS) ×2 IMPLANT
DURAPREP 26ML APPLICATOR (WOUND CARE) ×8 IMPLANT
ELECT CAUTERY BLADE 6.4 (BLADE) IMPLANT
ELECT NDL TIP 2.8 STRL (NEEDLE) IMPLANT
ELECT NEEDLE TIP 2.8 STRL (NEEDLE) IMPLANT
ELECT REM PT RETURN 9FT ADLT (ELECTROSURGICAL)
ELECTRODE REM PT RTRN 9FT ADLT (ELECTROSURGICAL) ×1 IMPLANT
EVACUATOR SILICONE 100CC (DRAIN) IMPLANT
GAUZE SPONGE 4X4 16PLY XRAY LF (GAUZE/BANDAGES/DRESSINGS) ×5 IMPLANT
GLOVE BIO SURGEON STRL SZ7.5 (GLOVE) ×3 IMPLANT
GLOVE BIOGEL PI IND STRL 7.0 (GLOVE) ×2 IMPLANT
GLOVE BIOGEL PI IND STRL 7.5 (GLOVE) ×1 IMPLANT
GLOVE BIOGEL PI INDICATOR 7.0 (GLOVE) ×4
GLOVE BIOGEL PI INDICATOR 7.5 (GLOVE) ×2
GLOVE EUDERMIC 7 POWDERFREE (GLOVE) ×6 IMPLANT
GOWN STRL REUS W/TWL LRG LVL3 (GOWN DISPOSABLE) ×14 IMPLANT
HEMOSTAT SURGICEL 2X14 (HEMOSTASIS) ×2 IMPLANT
MARKER SKIN DUAL TIP RULER LAB (MISCELLANEOUS) ×1 IMPLANT
NEEDLE HYPO 22GX1.5 SAFETY (NEEDLE) ×6 IMPLANT
NS IRRIG 1000ML POUR BTL (IV SOLUTION) ×8 IMPLANT
PACK ABDOMINAL GYN (CUSTOM PROCEDURE TRAY) ×6 IMPLANT
PAD OB MATERNITY 4.3X12.25 (PERSONAL CARE ITEMS) ×3 IMPLANT
PAD PREP 24X48 CUFFED NSTRL (MISCELLANEOUS) IMPLANT
PATCH VENTRAL MEDIUM 6.4 (Mesh Specialty) ×2 IMPLANT
PENCIL SMOKE EVAC W/HOLSTER (ELECTROSURGICAL) ×1 IMPLANT
PROTECTOR NERVE ULNAR (MISCELLANEOUS) ×3 IMPLANT
SPONGE LAP 18X18 X RAY DECT (DISPOSABLE) IMPLANT
SPONGE LAP 4X18 X RAY DECT (DISPOSABLE) IMPLANT
SPONGE SURGIFOAM ABS GEL 12-7 (HEMOSTASIS) IMPLANT
STAPLER VISISTAT 35W (STAPLE) IMPLANT
STRIP CLOSURE SKIN 1/2X4 (GAUZE/BANDAGES/DRESSINGS) ×2 IMPLANT
SUT CHROMIC 2 0 CT 1 (SUTURE) ×3 IMPLANT
SUT MNCRL AB 3-0 PS2 27 (SUTURE) IMPLANT
SUT MNCRL AB 4-0 PS2 18 (SUTURE) ×2 IMPLANT
SUT NOVA NAB DX-16 0-1 5-0 T12 (SUTURE) ×4 IMPLANT
SUT PDS AB 1 CTX 36 (SUTURE) IMPLANT
SUT PLAIN 2 0 XLH (SUTURE) ×3 IMPLANT
SUT PROLENE 0 CT 1 CR/8 (SUTURE) IMPLANT
SUT PROLENE 0 SH 30 (SUTURE) IMPLANT
SUT SILK 3 0 FS 1X18 (SUTURE) IMPLANT
SUT SILK 3 0 SH CR/8 (SUTURE) ×3 IMPLANT
SUT VIC AB 0 CT1 18XCR BRD8 (SUTURE) IMPLANT
SUT VIC AB 0 CT1 27 (SUTURE) ×33
SUT VIC AB 0 CT1 27XBRD ANBCTR (SUTURE) ×4 IMPLANT
SUT VIC AB 0 CT1 8-18 (SUTURE) ×3
SUT VIC AB 0 CTX 36 (SUTURE) ×12
SUT VIC AB 0 CTX36XBRD ANBCTRL (SUTURE) IMPLANT
SUT VIC AB 2-0 CT1 27 (SUTURE)
SUT VIC AB 2-0 CT1 TAPERPNT 27 (SUTURE) IMPLANT
SUT VIC AB 2-0 SH 27 (SUTURE)
SUT VIC AB 2-0 SH 27XBRD (SUTURE) IMPLANT
SUT VIC AB 3-0 SH 18 (SUTURE) ×2 IMPLANT
SUT VIC AB 3-0 SH 27 (SUTURE) ×21
SUT VIC AB 3-0 SH 27X BRD (SUTURE) ×5 IMPLANT
SYR BULB 3OZ (MISCELLANEOUS) IMPLANT
SYR CONTROL 10ML LL (SYRINGE) ×6 IMPLANT
TOWEL OR 17X24 6PK STRL BLUE (TOWEL DISPOSABLE) ×10 IMPLANT
TRAY FOLEY CATH SILVER 14FR (SET/KITS/TRAYS/PACK) ×3 IMPLANT
WATER STERILE IRR 1000ML POUR (IV SOLUTION) ×1 IMPLANT
YANKAUER SUCT BULB TIP NO VENT (SUCTIONS) ×2 IMPLANT

## 2016-07-17 NOTE — Op Note (Signed)
Patient Name:           Erika King   Date of Surgery:        07/17/2016  Pre op Diagnosis:      Incarcerated umbilical hernia  Post op Diagnosis:    Incarcerated umbilical hernia  Procedure:                 Open repair of incarcerated umbilical hernia with ventralex- ST composite mesh disc, 6.4 cm diameter  Surgeon:                     Edsel Petrin. 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 She has a symptomatic umbilical hernia.     Dr.  Everett Graff plans for elective uterine myomectomy through a Pfannenstiel incision, and the patient desires that the umbilical hernia be repaired at the same time.       The patient has noted a bulge in her umbilicus for 14 years, since her pregnancy. She's been to the emergency room twice with temporary incarceration but has never had nausea or vomiting. No prior surgery of the umbilicus. No history of inguinal hernia or inguinal pain.  Takes thyroid hormone, otherwise healthy. Gravida 1, para 1.      I discussed the indications, details, techniques, and numerous risk of the surgery with her. I told her I would probably reprep and redrape to do the surgery. If the uterine surgery as a class I surgery then we will consider synthetic mesh. I might do a primary repair after discussion regarding the conduct of the uterine surgery with Dr. Mancel Bale. She is aware the risks of bleeding, infection, recurrence of the hernia, nerve damage with chronic pain, injury to the intestinal organs with major reconstructive surgery, although unlikely. She understands all of these issues. All of her questions were answered. She agrees with this plan.  Operative Findings:       Dr. Mancel Bale performed uterine myomectomy and stated that it was a clean procedure and that she did not enter the uterine lumen.  We  therefore changed all of our instruments and gowns and gloves reprepped and redraped and redosed antibiotics and repaired the hernia with mesh.  The hernia defect was 2 cm or slightly less and contained incarcerated preperitoneal fat which was not ischemic or necrotic.  We simply reduced the preperitoneal fat and dissected it away from the undersurface of the fascia.  Without no other defects.  The composite mesh was placed under the fascia as an inlay, being careful to place the smooth side down toward the abdominal cavity and the rough side up toward the abdominal wall.  Procedure in Detail:          Following the induction of general endotracheal anesthesia Dr. Mancel Bale performed a uterine myomectomy through a Pfannenstiel incision.  After the procedure was completed I assumed control of the operative procedure.  We took all the drapes off reprepped and redraped excluding the Pfannenstiel incision.  We redosed antibiotics.  We changed all of our gloves gowns and instruments.  0.5% Marcaine with epinephrine was used as local infiltration anesthetic.     A transverse curvilinear incision was made at the lower rim of the umbilicus.  The  dissection was carried down to the fascia around the umbilical stalk.  The umbilical stalk and the umbilical skin were dissected up off of the defect.  Healthy free peritoneal fat was seen and was reduced through the defect and with finger palpation I could push the pre-peritoneal fat away from the undersurface of the fascia and I found no other defects.  A 6.4 cm diameter piece of ventral X ST composite mesh disc is brought to the operative field.  The tails were cut off.  The hernia was repaired with 4 interrupted mattress sutures of #1 Novafil.  Under direct vision I passed the Novafil sutures down through the fascia, through the edge of the mesh and then back up through the fascia.  After all 4 sutures were placed the mesh was folded and inserted into the preperitoneal space.   The sutures were lifted up and the mesh deployed nicely and without redundancy and overlap the defect quite nicely.  The sutures were tied.  The wound was irrigated.  The fascia was closed transversely over the top of the mesh with interrupted sutures of #1 Novafil.       The umbilicus was tacked down to the fascia with 3-0 Vicryl suture.  The subcutaneous tissue was closed with 3-0 Vicryl sutures.  The skin was closed with running subcuticular 4-0 Monocryl and Dermabond.  The patient tolerated the procedure well was taken to PACU in stable condition.  EBL for my procedure was about 10 mL.  Counts correct.  Complications none.   Edsel Petrin. Dalbert Batman, M.D., FACS General and Minimally Invasive Surgery Breast and Colorectal Surgery  07/17/2016 12:15 PM

## 2016-07-17 NOTE — Op Note (Signed)
Preop Diagnosis: Symptomatic Uterine Fibroids   Postop Diagnosis: Symptomatic Uterine Fibroids   Procedure: ABDOMINAL MYOMECTOMY   Anesthesia: General   Anesthesiologist: Albertha Ghee, MD   Attending: Everett Graff, MD   Assistant: Earnstine Regal, PA-C  Findings: 11 fibroids - 2 about 8-10cm one of which was in the right broad ligament.  1 about 4cm and the remainder less than 3cm  Pathology: Fibroids  Fluids: 3000 cc  UOP: 350 cc  EBL: 0000000 cc  Complications: None  Procedure: The patient was taken to the operating room after the risks, benefits and alternatives were discussed with the patient. The patient verbalized understanding and consent signed and witnessed. The patient was placed under general anesthesia and prepped and draped in the normal sterile fashion.  Time Out was performed per protocol.  A pfannenstiel skin incision was made and carried down to the underlying fascia.  The fascial incision was extended bilaterally with the Mayo scissors.  The inferior aspect of the fascial incision was grasped with Kocher clamps and the fascia excised from the rectus muscle.  The same was done on the superior aspect of the fascial incision.  The muscle was separated in the midline and the peritoneum entered bluntly and extended manually.  The bowel was packed away with 3 moist laparotomy sponges and the Kirt Boys retractor placed with the posterior blade.  A towel clamp was placed on the fundal fibroid and the uterus elevated up to the incision.  Vasopressin at a concentration of 20 units of vasopressin in 50cc of normal saline was injected into the fundal fibroid and the fibroid shelled out using a hemostat and bovie for cautery.  The same was done for the remaining fibroids.  All beds were repaired with 0 vicryl and the serosa was reapproximated with 3-0 vicryl.  Copious irrigation was performed.  Intercede was placed on all incisions.  One incision was at the fundus.  Two  were on the posterior wall of the uterus and one was on the anterior lower uterine segment.  There was one on the right wall as well.  The broad ligament was closed with 3-0 vicryl and surgicel placed in the bed prior to closing.  The ureter on that right side was note to peristalse without difficulty.  The endometrial cavity may have been entered so I would recommend future c-sections.  Laparotomy sponges were removed and the peritoneum repaired with 2-0 chromic after copious irrigation.  The fascia was repaired with 0 vicryl.  The subcutaneous tissue was irrigated and made hemostatic with the bovie. The skin was reapproximated with 3-0 monocryl and steristrips with bezoin applied.

## 2016-07-17 NOTE — Anesthesia Postprocedure Evaluation (Signed)
Anesthesia Post Note  Patient: Nidhi A Vollman  Procedure(s) Performed: Procedure(s) (LRB): MYOMECTOMY (N/A) HERNIA REPAIR UMBILICAL ADULT (N/A)  Patient location during evaluation: [facility] Anesthesia Type: General Level of consciousness: awake and alert Pain management: pain level controlled Vital Signs Assessment: post-procedure vital signs reviewed and stable Respiratory status: spontaneous breathing, nonlabored ventilation and patient connected to nasal cannula oxygen Cardiovascular status: stable Postop Assessment: no signs of nausea or vomiting and adequate PO intake Anesthetic complications: no        Last Vitals:  Vitals:   07/17/16 1631 07/17/16 1718  BP: 124/67 121/75  Pulse: 69 72  Resp: 16 11  Temp: 36.8 C 37.2 C    Last Pain:  Vitals:   07/17/16 1741  TempSrc:   PainSc: 3    Pain Goal: Patients Stated Pain Goal: 4 (07/17/16 1442)               France Ravens Hristova

## 2016-07-17 NOTE — Interval H&P Note (Signed)
History and Physical Interval Note:  07/17/2016 8:55 AM  Erika King  has presented today for surgery, with the diagnosis of Uterine Fibroids  The various methods of treatment have been discussed with the patient and family. After consideration of risks, benefits and other options for treatment, the patient has consented to  Procedure(s) with comments: MYOMECTOMY (N/A) - 2.5 hours HERNIA REPAIR UMBILICAL ADULT (N/A) - Insertion of mesh as a surgical intervention .  The patient's history has been reviewed, patient examined, no change in status, stable for surgery.  I have reviewed the patient's chart and labs.  Questions were answered to the patient's satisfaction.     Adin Hector

## 2016-07-17 NOTE — Anesthesia Procedure Notes (Signed)
Procedure Name: Intubation Date/Time: 07/17/2016 8:41 AM Performed by: Ignacia Bayley Pre-anesthesia Checklist: Patient identified, Emergency Drugs available, Suction available and Patient being monitored Patient Re-evaluated:Patient Re-evaluated prior to inductionOxygen Delivery Method: Circle system utilized Preoxygenation: Pre-oxygenation with 100% oxygen Intubation Type: IV induction Ventilation: Mask ventilation without difficulty Laryngoscope Size: Miller and 2 Grade View: Grade I Tube type: Oral Tube size: 7.0 mm Number of attempts: 1 Airway Equipment and Method: Stylet Placement Confirmation: ETT inserted through vocal cords under direct vision,  positive ETCO2 and breath sounds checked- equal and bilateral Secured at: 21 cm Tube secured with: Tape Dental Injury: Teeth and Oropharynx as per pre-operative assessment

## 2016-07-17 NOTE — Transfer of Care (Signed)
Immediate Anesthesia Transfer of Care Note  Patient: Erika King  Procedure(s) Performed: Procedure(s) with comments: MYOMECTOMY (N/A) - 2.5 hours HERNIA REPAIR UMBILICAL ADULT (N/A) - Insertion of mesh  Patient Location: PACU  Anesthesia Type:General  Level of Consciousness: sedated  Airway & Oxygen Therapy: Patient Spontanous Breathing and Patient connected to nasal cannula oxygen  Post-op Assessment: Report given to RN and Post -op Vital signs reviewed and stable  Post vital signs: stable  Last Vitals:  Vitals:   07/17/16 0717 07/17/16 1225  BP: 111/84 (!) 104/55  Pulse: 73 75  Resp: 16   Temp: 37 C (P) 36.6 C    Last Pain:  Vitals:   07/17/16 0717  TempSrc: Oral      Patients Stated Pain Goal: 4 ([id number])  Complications: No apparent anesthesia complications

## 2016-07-17 NOTE — Interval H&P Note (Signed)
History and Physical Interval Note:  07/17/2016 8:21 AM  Erika King  has presented today for surgery, with the diagnosis of Uterine Fibroids  The various methods of treatment have been discussed with the patient and family. After consideration of risks, benefits and other options for treatment, the patient has consented to  Procedure(s) with comments: ABDOMINAL MYOMECTOMY (N/A) - 2.5 hours HERNIA REPAIR UMBILICAL ADULT (N/A) (by Dr. Dalbert Batman) - Insertion of mesh as a surgical intervention .  The patient's history has been reviewed, patient examined, no change in status, stable for surgery.  I have reviewed the patient's chart and labs.  Questions were answered to the patient's satisfaction.     [name]

## 2016-07-17 NOTE — Progress Notes (Signed)
Day of Surgery Procedure(s) (LRB): MYOMECTOMY (N/A) HERNIA REPAIR UMBILICAL ADULT (N/A)  Subjective: Patient reports no N/V.  Denies any problems.     Objective: I have reviewed patient's vital signs and intake and output.  General: alert and no distress Resp: clear to auscultation bilaterally Cardio: regular rate and rhythm GI: soft, app tender, ND Extremities: no calf tenderness, scds are on Vaginal Bleeding: none  Assessment: s/p Procedure(s) with comments: MYOMECTOMY (N/A) - 2.5 hours HERNIA REPAIR UMBILICAL ADULT (N/A) (By Dr. Dalbert Batman)- Insertion of mesh: stable  Plan: Check CBC now  UOP adequate SCDs for DVT prophylaxis Cont Post op care   LOS: 0 days    Erika King Y 07/17/2016, 6:30 PM

## 2016-07-17 NOTE — Anesthesia Preprocedure Evaluation (Signed)
Anesthesia Evaluation  Patient identified by MRN, date of birth, ID band Patient awake    Reviewed: Allergy & Precautions, H&P , NPO status , Patient's Chart, lab work & pertinent test results  Airway Mallampati: II   Neck ROM: full    Dental   Pulmonary asthma ,    breath sounds clear to auscultation       Cardiovascular negative cardio ROS   Rhythm:regular Rate:Normal     Neuro/Psych    GI/Hepatic   Endo/Other  Hypothyroidism   Renal/GU      Musculoskeletal  (+) Arthritis ,   Abdominal   Peds  Hematology   Anesthesia Other Findings   Reproductive/Obstetrics                             Anesthesia Physical Anesthesia Plan  ASA: II  Anesthesia Plan: General   Post-op Pain Management:    Induction: Intravenous  Airway Management Planned: Oral ETT  Additional Equipment:   Intra-op Plan:   Post-operative Plan: Extubation in OR  Informed Consent: I have reviewed the patients History and Physical, chart, labs and discussed the procedure including the risks, benefits and alternatives for the proposed anesthesia with the patient or authorized representative who has indicated his/her understanding and acceptance.     Plan Discussed with: CRNA, Anesthesiologist and Surgeon  Anesthesia Plan Comments:         Anesthesia Quick Evaluation

## 2016-07-18 ENCOUNTER — Encounter (HOSPITAL_COMMUNITY): Payer: Self-pay | Admitting: Obstetrics and Gynecology

## 2016-07-18 LAB — COMPREHENSIVE METABOLIC PANEL
ALT: 13 U/L — AB (ref 14–54)
AST: 26 U/L (ref 15–41)
Albumin: 3.1 g/dL — ABNORMAL LOW (ref 3.5–5.0)
Alkaline Phosphatase: 30 U/L — ABNORMAL LOW (ref 38–126)
Anion gap: 4 — ABNORMAL LOW (ref 5–15)
BILIRUBIN TOTAL: 0.7 mg/dL (ref 0.3–1.2)
BUN: 11 mg/dL (ref 6–20)
CALCIUM: 8.2 mg/dL — AB (ref 8.9–10.3)
CO2: 27 mmol/L (ref 22–32)
CREATININE: 0.8 mg/dL (ref 0.44–1.00)
Chloride: 105 mmol/L (ref 101–111)
Glucose, Bld: 109 mg/dL — ABNORMAL HIGH (ref 65–99)
Potassium: 3.7 mmol/L (ref 3.5–5.1)
Sodium: 136 mmol/L (ref 135–145)
TOTAL PROTEIN: 6 g/dL — AB (ref 6.5–8.1)

## 2016-07-18 LAB — CBC
HEMATOCRIT: 26.4 % — AB (ref 36.0–46.0)
Hemoglobin: 9.1 g/dL — ABNORMAL LOW (ref 12.0–15.0)
MCH: 32.2 pg (ref 26.0–34.0)
MCHC: 34.5 g/dL (ref 30.0–36.0)
MCV: 93.3 fL (ref 78.0–100.0)
Platelets: 195 10*3/uL (ref 150–400)
RBC: 2.83 MIL/uL — AB (ref 3.87–5.11)
RDW: 13.2 % (ref 11.5–15.5)
WBC: 11.9 10*3/uL — AB (ref 4.0–10.5)

## 2016-07-18 MED ORDER — DIPHENHYDRAMINE HCL 25 MG PO CAPS
25.0000 mg | ORAL_CAPSULE | Freq: Four times a day (QID) | ORAL | Status: DC | PRN
  Administered 2016-07-18: 25 mg via ORAL
  Filled 2016-07-18: qty 1

## 2016-07-18 NOTE — Progress Notes (Signed)
Erika King is a11 y.o.  [id number]  Post Op Date # 1: Abdominal Myomectomy/Umbilical Hernia Repair with Mesh (Dr. Dalbert Batman)  Subjective: Patient is Doing well postoperatively. Patient has Pain is controlled with current analgesics. Medications being used: narcotic analgesics including hydromorphone (Dilaudid). Had some transient nausea and lightheadedness at initiation of ambulation but has since resolved. Tolerating liquids, ambulating in the halls, hasn't passed flatus or voided yet.    Objective: Vital signs in last 24 hours: Temp:  [97.9 F (36.6 C)-99 F (37.2 C)] 97.9 F (36.6 C) (12/20 0334) Pulse Rate:  [67-95] 79 (12/20 0334) Resp:  [8-22] 12 (12/20 0619) BP: (101-124)/(53-84) 106/71 (12/20 0334) SpO2:  [97 %-100 %] 100 % (12/20 0619) Weight:  [182 lb 2 oz (82.6 kg)] 182 lb 2 oz (82.6 kg) (12/19 1442)  Intake/Output from previous day: 12/19 0701 - 12/20 0700 In: [id number] [P.O.:391; I.V.:4100] Out: 3130 [Urine:2530] Intake/Output this shift: Total I/O In: 361 [P.O.:361] Out: 1475 [Urine:1475]  Recent Labs Lab 07/13/16 1555 07/17/16 1827 07/18/16 0600  WBC 5.9 13.7* 11.9*  HGB 12.4 9.5* 9.1*  HCT 35.8* 27.6* 26.4*  PLT 235 185 195     Recent Labs Lab 07/13/16 1555  NA 134*  K 3.8  CL 105  CO2 23  BUN 10  CREATININE 0.78  CALCIUM 9.0  GLUCOSE 83    EXAM: General: alert, cooperative and no distress Resp: clear to auscultation bilaterally Cardio: regular rate and rhythm, S1, S2 normal, no murmur, click, rub or gallop GI: Decreased bowel sounds;  umbilical incision with good wound edge approximation with mild edema but no erytthema or evidence of infection; lower abdominal dressing with dried dime-size stain at left edge/intact and dry. Extremities: Homans sign is negative, no sign of DVT and no calf tenderness.   Assessment: s/p Procedure(s): MYOMECTOMY HERNIA REPAIR UMBILICAL ADULT: stable, progressing well and anemia  Plan: Advance  diet Encourage ambulation Advance to PO medication Routine care  LOS: 1 day    Heiley Shaikh, PA-C 07/18/2016 6:54 AM

## 2016-07-18 NOTE — Discharge Instructions (Signed)
[facility] _____[facility]  UMBILICAL  REPAIR: POST OP INSTRUCTIONS  Always review your discharge instruction sheet given to you by the facility where your surgery was performed. IF YOU HAVE DISABILITY OR FAMILY LEAVE FORMS, YOU MUST BRING THEM TO THE OFFICE FOR PROCESSING.   DO NOT GIVE THEM TO YOUR DOCTOR.  1. A  prescription for pain medication may be given to you upon discharge.  Take your pain medication as prescribed, if needed.  If narcotic pain medicine is not needed, then you may take acetaminophen (Tylenol) or ibuprofen (Advil) as needed. 2. Take your usually prescribed medications unless otherwise directed. If you need a refill on your pain medication, please contact your pharmacy.  They will contact our office to request authorization. Prescriptions will not be filled after 5 pm or on week-ends. 3. You should follow a light diet the first 24 hours after arrival home, such as soup and crackers, etc.  Be sure to include lots of fluids daily.  Resume your normal diet the day after surgery. 4.Most patients will experience some swelling and bruising around the umbilicus .  Ice packs and reclining will help.  Swelling and bruising can take several days to resolve.  6. It is common to experience some constipation if taking pain medication after surgery.  Increasing fluid intake and taking a stool softener (such as Colace) will usually help or prevent this problem from occurring.  A mild laxative (Milk of Magnesia or Miralax) should be taken according to package directions if there are no bowel movements after 48 hours. 7. Unless discharge instructions indicate otherwise, you may remove your bandages 24-48 hours after surgery, and you may shower at that time.  You may have steri-strips (small skin tapes) in place directly over the incision.  These strips should be left on the skin for 7-10 days.  If your surgeon used skin glue on the incision, you may shower in 24 hours.  The glue will  flake off over the next 2-3 weeks.  Any sutures or staples will be removed at the office during your follow-up visit. 8. ACTIVITIES:  You may resume regular (light) daily activities beginning the next day--such as daily self-care, walking, climbing stairs--gradually increasing activities as tolerated.  You may have sexual intercourse when it is comfortable.  Refrain from any heavy lifting or straining until approved by your doctor.  a.You may drive when you are no longer taking prescription pain medication, you can comfortably wear a seatbelt, and you can safely maneuver your car and apply brakes. b.RETURN TO WORK:   _____________________________________________  9.You should see your doctor in the office for a follow-up appointment approximately 2-3 weeks after your surgery.  Make sure that you call for this appointment within a day or two after you arrive home to insure a convenient appointment time. 10.OTHER INSTRUCTIONS: __No sports, running, swimming, or lifting more than 20 pounds for 6 weeks _______________________    _____________________________________  WHEN TO CALL YOUR DOCTOR: 1. Fever over 101.0 2. Inability to urinate 3. Nausea and/or vomiting 4. Extreme swelling or bruising 5. Continued bleeding from incision. 6. Increased pain, redness, or drainage from the incision  The clinic staff is available to answer your questions during regular business hours.  Please dont hesitate to call and ask to speak to one of the nurses for clinical concerns.  If you have a medical emergency, go to the nearest emergency room or call 911.  A surgeon from [facility] is always on  call at the hospital   [address] [place], [place], [place]  [address] ?  [address], [place], [place]   [address] [phone] ? [phone] ? FAX [phone]) [phone] Web site: [email or web address] _______[facility]  UMBILICAL OR INGUINAL HERNIA REPAIR: POST OP  INSTRUCTIONS  Always review your discharge instruction sheet given to you by the facility where your surgery was performed. IF YOU HAVE DISABILITY OR FAMILY LEAVE FORMS, YOU MUST BRING THEM TO THE OFFICE FOR PROCESSING.   DO NOT GIVE THEM TO YOUR DOCTOR.  1. A  prescription for pain medication may be given to you upon discharge.  Take your pain medication as prescribed, if needed.  If narcotic pain medicine is not needed, then you may take acetaminophen (Tylenol) or ibuprofen (Advil) as needed. 2. Take your usually prescribed medications unless otherwise directed. If you need a refill on your pain medication, please contact your pharmacy.  They will contact our office to request authorization. Prescriptions will not be filled after 5 pm or on week-ends. 3. You should follow a light diet the first 24 hours after arrival home, such as soup and crackers, etc.  Be sure to include lots of fluids daily.  Resume your normal diet the day after surgery. 4.Most patients will experience some swelling and bruising around the umbilicus or in the groin and scrotum.  Ice packs and reclining will help.  Swelling and bruising can take several days to resolve.  6. It is common to experience some constipation if taking pain medication after surgery.  Increasing fluid intake and taking a stool softener (such as Colace) will usually help or prevent this problem from occurring.  A mild laxative (Milk of Magnesia or Miralax) should be taken according to package directions if there are no bowel movements after 48 hours. 7. Unless discharge instructions indicate otherwise, you may remove your bandages 24-48 hours after surgery, and you may shower at that time.  You may have steri-strips (small skin tapes) in place directly over the incision.  These strips should be left on the skin for 7-10 days.  If your surgeon used skin glue on the incision, you may shower in 24 hours.  The glue will flake off over the next 2-3 weeks.  Any  sutures or staples will be removed at the office during your follow-up visit. 8. ACTIVITIES:  You may resume regular (light) daily activities beginning the next day--such as daily self-care, walking, climbing stairs--gradually increasing activities as tolerated.  You may have sexual intercourse when it is comfortable.  Refrain from any heavy lifting or straining until approved by your doctor.  a.You may drive when you are no longer taking prescription pain medication, you can comfortably wear a seatbelt, and you can safely maneuver your car and apply brakes. b.RETURN TO WORK:   _____________________________________________  9.You should see your doctor in the office for a follow-up appointment approximately 2-3 weeks after your surgery.  Make sure that you call for this appointment within a day or two after you arrive home to insure a convenient appointment time. 10.OTHER INSTRUCTIONS: _________________________    _____________________________________  WHEN TO CALL YOUR DOCTOR: 7. Fever over 101.0 8. Inability to urinate 9. Nausea and/or vomiting 10. Extreme swelling or bruising 11. Continued bleeding from incision. 12. Increased pain, redness, or drainage from the incision  The clinic staff is available to answer your questions during regular business hours.  Please dont hesitate to call and ask to speak to one of  the nurses for clinical concerns.  If you have a medical emergency, go to the nearest emergency room or call 911.  A surgeon from [facility] is always on call at the hospital   [address] [place], [place], [place]  [address] ?  [address], [place], [place]   [address] [phone] ? [phone] ? FAX [phone]) [phone] Web site: [email or web address]   Call [facility] @ [phone] if:  You have a temperature greater than or equal to 100.4 degrees Farenheit orally You have pain that is not made better by the pain medication  given and taken as directed You have excessive bleeding or problems urinating  Take Colace (Docusate Sodium/Stool Softener) 100 mg 2-3 times daily while taking narcotic pain medicine to avoid constipation or until bowel movements are regular. Take over the counter iron supplementation of your choice twice a day for 12 weeks Take Ibuprofen 600 mg with food every 6 hours for 5 days then as needed for pain  You may drive after 2  weeks You may walk up steps  You may shower  You may resume a regular diet  Keep incisions clean and dry; removed honeycomb dressing on July 23, 2016 Do not lift over 15 pounds for 6 weeks Avoid anything in vagina for 6 weeks (or until after your post-operative visit)

## 2016-07-18 NOTE — Progress Notes (Signed)
Foley Catheter removed at 5:30am.  Pt unable to void all morning.  Voided 200 ml at 12noon, but still felt significant bladder pressure.  Bladder scan inaccurate because of honeycomb dressing.  I&O catheter done by B. Hassell Done, NT and C. Riley, NT. 800 ml pale yellow urine removed from bladder.  Pt since able to void 550 ml and 150 ml and feels bladder is empty.  Pt has begun passing flatus, is eating small amounts of food, and drinking plenty of fluids.  PCA and IVF dc'd at 14:55 and patient took one Percocet tablet.  Pt requesting abdominal binder.  Phoned Dr. Mancel Bale with above information.  See new orders.

## 2016-07-19 MED ORDER — OXYCODONE-ACETAMINOPHEN 5-325 MG PO TABS: ORAL_TABLET | ORAL | 0 refills | Status: DC

## 2016-07-19 MED ORDER — IBUPROFEN 600 MG PO TABS: ORAL_TABLET | ORAL | 1 refills | Status: DC

## 2016-07-19 MED ORDER — BISACODYL 10 MG RE SUPP
10.0000 mg | Freq: Once | RECTAL | Status: AC
  Administered 2016-07-19: 10 mg via RECTAL
  Filled 2016-07-19: qty 1

## 2016-07-19 MED ORDER — ALUM & MAG HYDROXIDE-SIMETH 200-200-20 MG/5ML PO SUSP
15.0000 mL | ORAL | Status: DC | PRN
  Administered 2016-07-19: 15 mL via ORAL
  Filled 2016-07-19: qty 30

## 2016-07-19 NOTE — Progress Notes (Signed)
Erika King is a78 y.o.  [id number]  Post Op Date # 2: Abdominal Myomectomy and Umbilical Hernia Repair (Dr. Dalbert Batman)  Subjective: Patient is Doing well postoperatively. Continues to have a sensation of the need to continue to void once she has emptied her bladder. Voiding 400-800 cc. Patient has Pain is controlled with current analgesics. Medications being used: prescription NSAID's including Ibuprofen 600 mg and narcotic analgesics including Percocet 5/325.,  Patient finds that she has better pain control with #2 Percocet. Tolerating a regular diet, passing flatus though requests something for belching, ambulating without dizziness and no nausea.     Objective: Vital signs in last 24 hours: Temp:  [97.5 F (36.4 C)-98.7 F (37.1 C)] 98.7 F (37.1 C) (12/20 2034) Pulse Rate:  [68-99] 99 (12/20 2034) Resp:  [12-20] 18 (12/20 2034) BP: (102-116)/(57-67) 102/57 (12/20 2034) SpO2:  [96 %-99 %] 97 % (12/20 2034)  Intake/Output from previous day: 12/20 0701 - 12/21 0700 In: 2768.8 [P.O.:1800; I.V.:968.8] Out: 5050 [Urine:5050] Intake/Output this shift: No intake/output data recorded.  Recent Labs Lab 07/13/16 1555 07/17/16 1827 07/18/16 0600  WBC 5.9 13.7* 11.9*  HGB 12.4 9.5* 9.1*  HCT 35.8* 27.6* 26.4*  PLT 235 185 195     Recent Labs Lab 07/13/16 1555 07/18/16 0600  NA 134* 136  K 3.8 3.7  CL 105 105  CO2 23 27  BUN 10 11  CREATININE 0.78 0.80  CALCIUM 9.0 8.2*  PROT  --  6.0*  BILITOT  --  0.7  ALKPHOS  --  30*  ALT  --  13*  AST  --  26  GLUCOSE 83 109*    EXAM: General: alert, cooperative and no distress Resp: clear to auscultation bilaterally Cardio: regular rate and rhythm, S1, S2 normal, no murmur, click, rub or gallop GI: Distended, soft, normal bowel sounds; umbilical incsion without evidence of infection with good wound edge approximation; lower abdominal dressing is dry and intact with a single dry stain as described  yesterday. Extremities: Homans sign is negative, no sign of DVT and no calf tenderness   Assessment: s/p Procedure(s): MYOMECTOMY HERNIA REPAIR UMBILICAL ADULT: stable, progressing well, tolerating diet and anemia  Plan: Encourage ambulation Routine care  Consider discharge home later today   LOS: 2 days    POWELL,ELMIRA, PA-C 07/19/2016 7:34 AM  Pt passing gas but still a little distended.  Will anticipate discharge tomorrow.  She denies any N/V and tolerated small amount of regular food.

## 2016-07-19 NOTE — Discharge Summary (Signed)
Physician Discharge Summary  Patient ID: Erika King MRN: [id number] DOB/AGE: 11/25/1980 35 y.o.  Admit date: 07/17/2016 Discharge date: 07/19/2016   Discharge Diagnoses:  Symptomatic Uterine Fibroids and Umbilical Hernia Active Problems:   Fibroid, uterine   Operation: Abdominal Myomectomy and Umbilical Hernia Repair with Mesh (Dr. Fanny Skates)   Discharged Condition: [facility] Course: On the day of admission the patient underwent the aforementioned procedures and tolerated them well.  Post operative course was marked by transient urinary retention however, resolved spontaneously with bladder rest.  By post operative day #3  the patient had resumed bowel and bladder function, tolerated a post operative hemoglobin of 9.1 and was therefore deemed ready for discharge home. Disposition: Home to self care  Discharge Medications:  Allergies as of 07/19/2016   No Known Allergies     Medication List    STOP taking these medications   acetaminophen 500 MG tablet Commonly known as:  TYLENOL     TAKE these medications   albuterol 108 (90 Base) MCG/ACT inhaler Commonly known as:  PROVENTIL HFA;VENTOLIN HFA Inhale 1-2 puffs into the lungs every 6 (six) hours as needed for wheezing or shortness of breath.   CALCIUM 500 PO Take 1 tablet by mouth every morning.   CHRONO-IMMUNE SHIELD PO Take 1 tablet by mouth as needed (when feeling under the weather).   ENBREL 50 MG/ML injection Generic drug:  etanercept Inject 50 mg into the skin every 30 (thirty) days.   fluticasone 50 MCG/ACT nasal spray Commonly known as:  FLONASE Place 1 spray into both nostrils daily as needed for allergies or rhinitis.   ibuprofen 600 MG tablet Commonly known as:  ADVIL,MOTRIN 1 po  pc every 6 hours for 5 days then prn-pain   levothyroxine 137 MCG tablet Commonly known as:  SYNTHROID, LEVOTHROID Take 137 mcg by mouth daily before breakfast.   loratadine 10 MG tablet Commonly known  as:  CLARITIN Take 10 mg by mouth daily as needed for allergies.   montelukast 10 MG tablet Commonly known as:  SINGULAIR take 1 tablet by mouth daily as needed for allergies   multivitamin capsule Take 1 capsule by mouth daily.   oxyCODONE-acetaminophen 5-325 MG tablet Commonly known as:  PERCOCET/ROXICET 1-2 every 6 hours prn-post operative pain         Follow-up: Dr. Harvie Bridge. Mancel Bale on August 27, 2016 at  8:30 a.m.         Dr. Fanny Skates in 2 weeks,  patient to call for an appointment   Signed: Earnstine Regal, PA-C 07/19/2016, 7:49 AM

## 2016-07-20 ENCOUNTER — Other Ambulatory Visit: Payer: Self-pay | Admitting: *Deleted

## 2016-07-20 LAB — URINE CULTURE: Culture: NO GROWTH

## 2016-07-20 MED ORDER — ETANERCEPT 50 MG/ML ~~LOC~~ SOAJ: 50.0000 mg | SUBCUTANEOUS | 0 refills | Status: DC

## 2016-07-20 NOTE — Progress Notes (Signed)
Pt discharged with written instructions. Pt verbalized an understanding. No concerns noted. Imari Reen L Alekxander Isola, RN 

## 2016-07-20 NOTE — Telephone Encounter (Signed)
Prescription refill received via fax for Enbrel  Last Visit: 01/05/16 Next Visit was due 05/2016. Message sent to the front desk to schedule appointment.  Labs: 07/18/16 Hgb 9.1, Alkaline Phos. 30 and WBC 11.9 Tb Gold: 05/27/15 Neg   Okay to refill Enbrel?

## 2016-07-20 NOTE — Telephone Encounter (Signed)
ok 

## 2016-07-20 NOTE — Progress Notes (Signed)
Erika King is a24 y.o.  [id number]  Post Op Date # 3: Abdominal Myomectomy and Umbilical Hernia Repair (Dr. Fanny Skates)  Subjective: Patient is Doing well postoperatively. Patient has Pain is controlled with current analgesics. Medications being used: prescription NSAID's including Ibuprofen 600 mg and narcotic analgesics including Percocet 5/325., Ambulating in the halls, tolerating a regular diet,  passing flatus and voiding without difficulty.   Objective: Vital signs in last 24 hours: Temp:  [98.5 F (36.9 C)-98.9 F (37.2 C)] 98.9 F (37.2 C) (12/21 2027) Pulse Rate:  [73-82] 73 (12/21 2027) Resp:  [18] 18 (12/21 2027) BP: (130-135)/(72-78) 135/72 (12/21 2027) SpO2:  [99 %-100 %] 100 % (12/21 2027)  Intake/Output from previous day: 12/21 0701 - 12/22 0700 In: 1440 [P.O.:1440] Out: 2275 [Urine:2275] Intake/Output this shift: No intake/output data recorded.  Recent Labs Lab 07/13/16 1555 07/17/16 1827 07/18/16 0600  WBC 5.9 13.7* 11.9*  HGB 12.4 9.5* 9.1*  HCT 35.8* 27.6* 26.4*  PLT 235 185 195     Recent Labs Lab 07/13/16 1555 07/18/16 0600  NA 134* 136  K 3.8 3.7  CL 105 105  CO2 23 27  BUN 10 11  CREATININE 0.78 0.80  CALCIUM 9.0 8.2*  PROT  --  6.0*  BILITOT  --  0.7  ALKPHOS  --  30*  ALT  --  13*  AST  --  26  GLUCOSE 83 109*    EXAM: General: alert, cooperative and no distress Resp: clear to auscultation bilaterally Cardio: regular rate and rhythm, S1, S2 normal, no murmur, click, rub or gallop GI: Decreased distention, bowel sounds present, umbilical incision without evidence of infection; lower abdominal dressing intact and dry. Extremities: Homans sign is negative, no sign of DVT and no calf tenderness.   Assessment: s/p Procedure(s): MYOMECTOMY HERNIA REPAIR UMBILICAL ADULT: stable, progressing well and anemia  Plan: Discharge home  LOS: 3 days    Erika Bonneville, PA-C 07/20/2016 7:08 AM

## 2016-08-17 ENCOUNTER — Other Ambulatory Visit: Payer: Self-pay | Admitting: Radiology

## 2016-08-17 NOTE — Telephone Encounter (Signed)
Refill request received via fax for Enbrel/ [facility]

## 2016-08-20 NOTE — Telephone Encounter (Signed)
Patient needs appointment will have front desk reach out to her to schedule.

## 2016-08-21 ENCOUNTER — Other Ambulatory Visit: Payer: Self-pay | Admitting: Rheumatology

## 2016-08-21 ENCOUNTER — Encounter: Payer: Self-pay | Admitting: Rheumatology

## 2016-08-21 NOTE — Progress Notes (Signed)
   Refill request on Enbrel denied as of 08/21/2016. Date of requests from [facility] RX is from 08/13/2016.  Reason for denial: #1  A 1 month supply of Enbrel was sent into [facility] on 07/20/2016. #2: Patient was requested to get updated TB gold since her last one was 05/27/2015 #3: No updated TB gold in chart despite giving patient extra one month to have this taken care of #4: CBC with differential CMP with GFR was done December 2017. Please see Epic for those values #5: Enbrel request for refill denied. #6: We attempted to reach out to patient once again today but her mailbox is full and cannot leave any message #7: We will send a letter informing patient that she needs TB goal updated and labs need to be done on a timely basis.  Eliezer Lofts, PA-C

## 2016-08-21 NOTE — Addendum Note (Signed)
Addended by: Carole Binning on: 08/21/2016 11:34 AM   Modules accepted: Orders

## 2016-08-21 NOTE — Telephone Encounter (Signed)
Enbrel refill is denied We already refill the last month for 30 days.Her TB gold is way past due.Until TB gold is updated, no more Enbrel refills will be possible.Please send a letter notifying the patient to get updated TB gold.I do see that she had labs done December 2017 that included CBC and CMP  Remind patient that our office does do labs that we can get the TB gold done through our office. R know the hours.

## 2016-08-21 NOTE — Telephone Encounter (Signed)
Attempted to contact patient to advise her she is due for TB Gold. Patient's mailbox is full and unable to leave a message.

## 2016-08-22 ENCOUNTER — Telehealth: Payer: Self-pay | Admitting: *Deleted

## 2016-08-22 ENCOUNTER — Telehealth: Payer: Self-pay | Admitting: Rheumatology

## 2016-08-22 NOTE — Telephone Encounter (Signed)
Left message for patient to come into office for labs before medications can be refilled. Left labs hours on voice mail. Note: patient needs TB Gold before Enbrel can be refilled.

## 2016-08-22 NOTE — Telephone Encounter (Signed)
LMOM for patient to call back to schedule follow up with Dr. Estanislado Pandy.

## 2016-09-03 ENCOUNTER — Ambulatory Visit: Payer: 59 | Admitting: Podiatry

## 2016-11-20 ENCOUNTER — Telehealth: Payer: Self-pay

## 2016-11-20 NOTE — Telephone Encounter (Signed)
Received a call from [facility] from cover my meds regarding a prior authorization for Enbrel. The pharmacy is requesting a prior authorization. After reviewing her profile, the refill request has been denied. Do you want me to submit the prior auth. for enbrel?  Nathanyel Defenbaugh, [place], CPhT

## 2016-11-20 NOTE — Telephone Encounter (Addendum)
It does not appear she is using Enbrel so I called her she states she has only been using Enbrel once a month or every other month.   She states she had labs drawn about 4 weeks ago, she will fax the results to [place]  Yes, we should get the prior authorization, patient states she has done well on the Enbrel once a month. I did transfer her to make a follow up appointment

## 2016-11-21 NOTE — Telephone Encounter (Signed)
Submitted a prior authorization request for patient's enbrel through cover my meds. Waiting for a response.  Landyn Buckalew, [place], CPhT

## 2016-11-23 NOTE — Telephone Encounter (Signed)
Received a fax from [employer] regarding a prior authorization approval for Enbrel [employer] through 4/61/90.   Reference number: [id number] Phone number:[phone]  Will send document to scan center.  Jahziel Sinn, [place], CPhT  8:10 AM

## 2016-12-14 ENCOUNTER — Telehealth: Payer: Self-pay | Admitting: *Deleted

## 2016-12-14 DIAGNOSIS — R7989 Other specified abnormal findings of blood chemistry: Secondary | ICD-10-CM | POA: Insufficient documentation

## 2016-12-14 DIAGNOSIS — R945 Abnormal results of liver function studies: Secondary | ICD-10-CM

## 2016-12-14 DIAGNOSIS — M0579 Rheumatoid arthritis with rheumatoid factor of multiple sites without organ or systems involvement: Secondary | ICD-10-CM | POA: Insufficient documentation

## 2016-12-14 DIAGNOSIS — Z8639 Personal history of other endocrine, nutritional and metabolic disease: Secondary | ICD-10-CM | POA: Insufficient documentation

## 2016-12-14 DIAGNOSIS — Z8709 Personal history of other diseases of the respiratory system: Secondary | ICD-10-CM | POA: Insufficient documentation

## 2016-12-14 DIAGNOSIS — E559 Vitamin D deficiency, unspecified: Secondary | ICD-10-CM | POA: Insufficient documentation

## 2016-12-14 DIAGNOSIS — G5602 Carpal tunnel syndrome, left upper limb: Secondary | ICD-10-CM | POA: Insufficient documentation

## 2016-12-14 NOTE — Telephone Encounter (Signed)
Received lab results form [place] at [facility] drawn on 10/22/16  CBC WNL CMP shows AST 50  Recommendations from Dr.Deveshwar to avoid NSAIDS. Patient advised and scheduled for follow up visit.

## 2016-12-14 NOTE — Progress Notes (Deleted)
Office Visit Note  Patient: Erika King             Date of Birth: March 10, 1981           MRN: [id number]             PCP: Glendale Chard, MD Referring: Glendale Chard, MD Visit Date: 12/21/2016 Occupation: @GUAROCC @    Subjective:  No chief complaint on file.   History of Present Illness: Erika King is a 36 y.o. female ***   Activities of Daily Living:  Patient reports morning stiffness for *** {minute/hour:19697}.   Patient {ACTIONS;DENIES/REPORTS:21021675::"Denies"} nocturnal pain.  Difficulty dressing/grooming: {ACTIONS;DENIES/REPORTS:21021675::"Denies"} Difficulty climbing stairs: {ACTIONS;DENIES/REPORTS:21021675::"Denies"} Difficulty getting out of chair: {ACTIONS;DENIES/REPORTS:21021675::"Denies"} Difficulty using hands for taps, buttons, cutlery, and/or writing: {ACTIONS;DENIES/REPORTS:21021675::"Denies"}   No Rheumatology ROS completed.   PMFS History:  Patient Active Problem List   Diagnosis Date Noted  . Rheumatoid arthritis involving multiple sites with positive rheumatoid factor ([place]) 12/14/2016  . Elevated LFTs 12/14/2016  . Left carpal tunnel syndrome 12/14/2016  . History of asthma 12/14/2016  . History of hyperthyroidism 12/14/2016  . Vitamin D deficiency 12/14/2016  . Fibroid, uterine 07/17/2016    Past Medical History:  Diagnosis Date  . Asthma   . Broken arm   . Broken foot   . Environmental and seasonal allergies   . History of radioactive iodine thyroid ablation 09/2015  . Hypothyroidism   . Indigestion   . RA (rheumatoid arthritis) ([place])   . Sinusitis   . Vitamin D deficiency     No family history on file. Past Surgical History:  Procedure Laterality Date  . MYOMECTOMY N/A 07/17/2016   Procedure: MYOMECTOMY;  Surgeon: Everett Graff, MD;  Location: [facility];  Service: Gynecology;  Laterality: N/A;  2.5 hours  . ROOT CANAL    . TOE SURGERY    . UMBILICAL HERNIA REPAIR N/A 07/17/2016   Procedure: HERNIA REPAIR UMBILICAL  ADULT;  Surgeon: Fanny Skates, MD;  Location: [facility];  Service: General;  Laterality: N/A;  Insertion of mesh   Social History   Social History Narrative  . No narrative on file     Objective: Vital Signs: There were no vitals taken for this visit.   Physical Exam   Musculoskeletal Exam: ***  CDAI Exam: No CDAI exam completed.    Investigation: Findings:  Tb gold negative 04/2015   CBC Latest Ref Rng & Units 07/18/2016 07/17/2016 07/13/2016  WBC 4.0 - 10.5 K/uL 11.9(H) 13.7(H) 5.9  Hemoglobin 12.0 - 15.0 g/dL 9.1(L) 9.5(L) 12.4  Hematocrit 36.0 - 46.0 % 26.4(L) 27.6(L) 35.8(L)  Platelets 150 - 400 K/uL 195 185 235    CMP Latest Ref Rng & Units 07/18/2016 07/13/2016  Glucose 65 - 99 mg/dL 109(H) 83  BUN 6 - 20 mg/dL 11 10  Creatinine 0.44 - 1.00 mg/dL 0.80 0.78  Sodium 135 - 145 mmol/L 136 134(L)  Potassium 3.5 - 5.1 mmol/L 3.7 3.8  Chloride 101 - 111 mmol/L 105 105  CO2 22 - 32 mmol/L 27 23  Calcium 8.9 - 10.3 mg/dL 8.2(L) 9.0  Total Protein 6.5 - 8.1 g/dL 6.0(L) -  Total Bilirubin 0.3 - 1.2 mg/dL 0.7 -  Alkaline Phos 38 - 126 U/L 30(L) -  AST 15 - 41 U/L 26 -  ALT 14 - 54 U/L 13(L) -    Imaging: No results found.  Speciality Comments: No specialty comments available.    Procedures:  No procedures performed Allergies: Patient has no known allergies.  Assessment / Plan:     Visit Diagnoses: Rheumatoid arthritis involving multiple sites with positive rheumatoid factor (HCC)  Elevated LFTs  Left carpal tunnel syndrome  History of asthma  History of hyperthyroidism - Dr Buddy Duty autoimmune thyroiditis   Vitamin D deficiency    Orders: No orders of the defined types were placed in this encounter.  No orders of the defined types were placed in this encounter.   Face-to-face time spent with patient was *** minutes. 50% of time was spent in counseling and coordination of care.  Follow-Up Instructions: No Follow-up on file.   Bo Merino,  MD  Note - This record has been created using Editor, commissioning.  Chart creation errors have been sought, but may not always  have been located. Such creation errors do not reflect on  the standard of medical care.

## 2016-12-21 ENCOUNTER — Ambulatory Visit: Payer: 59 | Admitting: Rheumatology

## 2017-02-06 ENCOUNTER — Other Ambulatory Visit: Payer: Self-pay | Admitting: Nurse Practitioner

## 2017-02-06 DIAGNOSIS — N644 Mastodynia: Secondary | ICD-10-CM

## 2017-07-03 NOTE — Progress Notes (Deleted)
   Office Visit Note  Patient: Erika King             Date of Birth: 1980/10/05           MRN: [id number]             PCP: Glendale Chard, MD Referring: Glendale Chard, MD Visit Date: 07/04/2017 Occupation: @GUAROCC @    Subjective:  No chief complaint on file.   History of Present Illness: Erika King is a 36 y.o. female ***   Activities of Daily Living:  Patient reports morning stiffness for *** {minute/hour:19697}.   Patient {ACTIONS;DENIES/REPORTS:21021675::"Denies"} nocturnal pain.  Difficulty dressing/grooming: {ACTIONS;DENIES/REPORTS:21021675::"Denies"} Difficulty climbing stairs: {ACTIONS;DENIES/REPORTS:21021675::"Denies"} Difficulty getting out of chair: {ACTIONS;DENIES/REPORTS:21021675::"Denies"} Difficulty using hands for taps, buttons, cutlery, and/or writing: {ACTIONS;DENIES/REPORTS:21021675::"Denies"}   No Rheumatology ROS completed.   PMFS History:  Patient Active Problem List   Diagnosis Date Noted  . Rheumatoid arthritis involving multiple sites with positive rheumatoid factor ([place]) 12/14/2016  . Elevated LFTs 12/14/2016  . Left carpal tunnel syndrome 12/14/2016  . History of asthma 12/14/2016  . History of hyperthyroidism 12/14/2016  . Vitamin D deficiency 12/14/2016  . Fibroid, uterine 07/17/2016    Past Medical History:  Diagnosis Date  . Asthma   . Broken arm   . Broken foot   . Environmental and seasonal allergies   . History of radioactive iodine thyroid ablation 09/2015  . Hypothyroidism   . Indigestion   . RA (rheumatoid arthritis) ([place])   . Sinusitis   . Vitamin D deficiency     No family history on file. Past Surgical History:  Procedure Laterality Date  . MYOMECTOMY N/A 07/17/2016   Procedure: MYOMECTOMY;  Surgeon: Everett Graff, MD;  Location: [facility];  Service: Gynecology;  Laterality: N/A;  2.5 hours  . ROOT CANAL    . TOE SURGERY    . UMBILICAL HERNIA REPAIR N/A 07/17/2016   Procedure: HERNIA REPAIR UMBILICAL  ADULT;  Surgeon: Fanny Skates, MD;  Location: [facility];  Service: General;  Laterality: N/A;  Insertion of mesh   Social History   Social History Narrative  . Not on file     Objective: Vital Signs: There were no vitals taken for this visit.   Physical Exam   Musculoskeletal Exam: ***  CDAI Exam: No CDAI exam completed.    Investigation: No additional findings.   Imaging: No results found.  Speciality Comments: No specialty comments available.    Procedures:  No procedures performed Allergies: Patient has no known allergies.   Assessment / Plan:     Visit Diagnoses: No diagnosis found.    Orders: No orders of the defined types were placed in this encounter.  No orders of the defined types were placed in this encounter.   Face-to-face time spent with patient was *** minutes. 50% of time was spent in counseling and coordination of care.  Follow-Up Instructions: No Follow-up on file.   Earnestine Mealing, CMA  Note - This record has been created using Editor, commissioning.  Chart creation errors have been sought, but may not always  have been located. Such creation errors do not reflect on  the standard of medical care.

## 2017-07-04 ENCOUNTER — Ambulatory Visit: Payer: 59 | Admitting: Rheumatology

## 2017-07-16 ENCOUNTER — Telehealth: Payer: Self-pay

## 2017-07-16 NOTE — Telephone Encounter (Signed)
Patient states she was in [place] on 06/17/17 and states she did an excessive amount of walking. Patient states she had walked 8-9 miles. Patient states she has had pain and swelling in her left knee since then. Patient states she works at a [employer] and was able to have an x-ray preformed and it did not show any problems. . Patient states she has been using Ibuprofen, ice, a sleeve brace and has even had a Prednisone taper and it has not helped. Patient states she has made an appointment with PCP for and injection as we do not have any appointments soon. Patient will have them refer her to orthopedics if the injection does not help.

## 2017-07-16 NOTE — Telephone Encounter (Signed)
Patient would like a call back as soon as possible.  CB# [phone].  Please advise.  Thank you.

## 2017-10-30 NOTE — Progress Notes (Signed)
Office Visit Note  Patient: Erika King             Date of Birth: 1981-05-04           MRN: [id number]             PCP: Kathyrn Lass, MD Referring: Glendale Chard, MD Visit Date: 11/13/2017 Occupation: @GUAROCC @    Subjective:  Left knee pain    History of Present Illness: Erika King is a 37 y.o. female with history of seropositive rheumatoid arthritis.  Patient states that her last injection of Enbrel was in October 2018.  She states that she has been injecting Enbrel about 3 times a year.  She states that at the beginning of October she started having increased pain in her left knee.  She states she had warmth swelling and redness.  She states that her left knee felt very stiff.  She denies any mechanical symptoms.  She states that she was taking ibuprofen icing and using an Ace wrap.  She denies any injuries to this joint.  She states that she had an x-ray performed at another office which was unremarkable.  She states that she was put on a prednisone taper which helped with the swelling minimally but did not help her pain.  She states that in December she had 2 subcutaneous injections of her knee.  She states that since the injection she asked the pain a 2 out of 10.  She states that the swelling and stiffness has improved significantly.  She denies pain or swelling in any other joints.    Activities of Daily Living:  Patient reports morning stiffness for 0  minutes.   Patient Denies nocturnal pain.  Difficulty dressing/grooming: Denies Difficulty climbing stairs: Denies Difficulty getting out of chair: Denies Difficulty using hands for taps, buttons, cutlery, and/or writing: Denies   Review of Systems  Constitutional: Negative for fatigue.  HENT: Negative for mouth sores, mouth dryness and nose dryness.   Eyes: Negative for pain, visual disturbance and dryness.  Respiratory: Negative for cough, hemoptysis, shortness of breath and difficulty breathing.     Cardiovascular: Negative for chest pain, palpitations, hypertension and swelling in legs/feet.  Gastrointestinal: Negative for blood in stool, constipation and diarrhea.  Endocrine: Negative for increased urination.  Genitourinary: Negative for painful urination.  Musculoskeletal: Positive for arthralgias and joint pain. Negative for joint swelling, myalgias, muscle weakness, morning stiffness, muscle tenderness and myalgias.  Skin: Negative for color change, pallor, rash, hair loss, nodules/bumps, skin tightness, ulcers and sensitivity to sunlight.  Allergic/Immunologic: Negative for susceptible to infections.  Neurological: Negative for dizziness, numbness, headaches and weakness.  Hematological: Negative for swollen glands.  Psychiatric/Behavioral: Negative for depressed mood and sleep disturbance. The patient is not nervous/anxious.     PMFS History:  Patient Active Problem List   Diagnosis Date Noted  . Rheumatoid arthritis involving multiple sites with positive rheumatoid factor ([place]) 12/14/2016  . Elevated LFTs 12/14/2016  . Left carpal tunnel syndrome 12/14/2016  . History of asthma 12/14/2016  . History of hyperthyroidism 12/14/2016  . Vitamin D deficiency 12/14/2016  . Fibroid, uterine 07/17/2016    Past Medical History:  Diagnosis Date  . Asthma   . Broken arm   . Broken foot   . Environmental and seasonal allergies   . History of radioactive iodine thyroid ablation 09/2015  . Hypothyroidism   . Indigestion   . RA (rheumatoid arthritis) ([place])   . Sinusitis   . Vitamin D deficiency  Family History  Problem Relation Age of Onset  . Pneumonia Mother   . Huntington's disease Mother   . Cancer Father        lung  . Hypertension Sister   . Depression Sister   . Huntington's disease Brother   . Healthy Daughter    Past Surgical History:  Procedure Laterality Date  . MYOMECTOMY N/A 07/17/2016   Procedure: MYOMECTOMY;  Surgeon: Everett Graff, MD;  Location:  [facility];  Service: Gynecology;  Laterality: N/A;  2.5 hours  . ROOT CANAL    . TOE SURGERY    . UMBILICAL HERNIA REPAIR N/A 07/17/2016   Procedure: HERNIA REPAIR UMBILICAL ADULT;  Surgeon: Fanny Skates, MD;  Location: [facility];  Service: General;  Laterality: N/A;  Insertion of mesh   Social History   Social History Narrative  . Not on file     Objective: Vital Signs: BP 121/76 (BP Location: Left Arm, Patient Position: Sitting, Cuff Size: Normal)   Pulse 82   Resp 15   Ht 5\' 6"  (1.676 m)   Wt 202 lb (91.6 kg)   BMI 32.60 kg/m    Physical Exam  Constitutional: She is oriented to person, place, and time. She appears well-developed and well-nourished.  HENT:  Head: Normocephalic and atraumatic.  Eyes: Conjunctivae and EOM are normal.  Neck: Normal range of motion.  Cardiovascular: Normal rate, regular rhythm, normal heart sounds and intact distal pulses.  Pulmonary/Chest: Effort normal and breath sounds normal.  Abdominal: Soft. Bowel sounds are normal.  Lymphadenopathy:    She has no cervical adenopathy.  Neurological: She is alert and oriented to person, place, and time.  Skin: Skin is warm and dry. Capillary refill takes less than 2 seconds.  Psychiatric: She has a normal mood and affect. Her behavior is normal.  Nursing note and vitals reviewed.    Musculoskeletal Exam: C-spine, thoracic spine, lumbar spine good range of motion.  No midline spinal tenderness.  No SI joint tenderness.  Shoulder joints, elbow joints, wrist joints, MCPs, PIPs, DIPs good range of motion with no synovitis.  Hip joints, knee joints, ankle joints, MTPs, PIPs, DIPs good range of motion with no synovitis.  She has a small effusion of her left knee.  No warmth or erythema.  She has crepitus of her bilateral knees.  CDAI Exam: CDAI Homunculus Exam:   Joint Counts:  CDAI Tender Joint count: 0 CDAI Swollen Joint count: 0  Global Assessments:  Patient Global Assessment: 0 Provider Global  Assessment: 0    Investigation: No additional findings. CBC Latest Ref Rng & Units 07/18/2016 07/17/2016 07/13/2016  WBC 4.0 - 10.5 K/uL 11.9(H) 13.7(H) 5.9  Hemoglobin 12.0 - 15.0 g/dL 9.1(L) 9.5(L) 12.4  Hematocrit 36.0 - 46.0 % 26.4(L) 27.6(L) 35.8(L)  Platelets 150 - 400 K/uL 195 185 235   CMP Latest Ref Rng & Units 07/18/2016 07/13/2016  Glucose 65 - 99 mg/dL 109(H) 83  BUN 6 - 20 mg/dL 11 10  Creatinine 0.44 - 1.00 mg/dL 0.80 0.78  Sodium 135 - 145 mmol/L 136 134(L)  Potassium 3.5 - 5.1 mmol/L 3.7 3.8  Chloride 101 - 111 mmol/L 105 105  CO2 22 - 32 mmol/L 27 23  Calcium 8.9 - 10.3 mg/dL 8.2(L) 9.0  Total Protein 6.5 - 8.1 g/dL 6.0(L) -  Total Bilirubin 0.3 - 1.2 mg/dL 0.7 -  Alkaline Phos 38 - 126 U/L 30(L) -  AST 15 - 41 U/L 26 -  ALT 14 - 54 U/L 13(L) -  Imaging: No results found.  Speciality Comments: No specialty comments available.    Procedures:  No procedures performed Allergies: Black walnut pollen allergy skin test; Molds & smuts; Other; and Peanut oil   Assessment / Plan:     Visit Diagnoses: Rheumatoid arthritis involving multiple sites with positive rheumatoid factor (HCC) - +CCP -She has no synovitis on exam. She has not had any recent rheumatoid arthritis flares. She has no joint pain at this time. She has a small effusion of her left knee on exam today.  No warmth or erythema.  Her stiffness, swelling, and pain have improved significantly since December.  She declined a cortisone injection today. Her last Enbrel injection was in October 2018.  She has been injecting Enbrel every 4 months.  She was advised to start injecting Enbrel every week as prescribed.  The risk of antibody formation was discussed in detail.  She will return in 5 months and have lab work every 3 months.  a refill of Enbrel was sent to the pharmacy. She requested her sed rate, RF, and CCP to be checked today.  Plan: Sedimentation rate, Rheumatoid factor, Cyclic citrul peptide  antibody, IgG  High risk medication use -CBC, CMP and TB gold were ordered today to monitor for drug toxicity.  She will return in July and every 3 months for lab work.  Plan: CBC with Differential/Platelet, COMPLETE METABOLIC PANEL WITH GFR, QuantiFERON-TB Gold Plus   History of vitamin D deficiency: She takes vitamin D 2000 units daily.  Left carpal tunnel syndrome: Resolved  Other medical conditions are listed as follows:  History of hyperthyroidism  History of asthma       Orders: Orders Placed This Encounter  Procedures  . CBC with Differential/Platelet  . COMPLETE METABOLIC PANEL WITH GFR  . QuantiFERON-TB Gold Plus  . Sedimentation rate  . Rheumatoid factor  . Cyclic citrul peptide antibody, IgG   No orders of the defined types were placed in this encounter.   Face-to-face time spent with patient was 30 minutes. >50% of time was spent in counseling and coordination of care.  Follow-Up Instructions: Return in about 5 months (around 04/15/2018) for Rheumatoid arthritis.   Ofilia Neas, PA-C   I examined and evaluated the patient with Hazel Sams PA. She had some swelling in her left knee nut no warmth. WE had detailed discussion regarding the treatment.  She was in agreement to take Enbrel injections on a weekly basis.  She will get labs every 3 months to monitor for drug toxicity and will return here for every 5 months for follow-up visit.  The plan of care was discussed as noted above.  Bo Merino, MD  Note - This record has been created using Editor, commissioning.  Chart creation errors have been sought, but may not always  have been located. Such creation errors do not reflect on  the standard of medical care.

## 2017-11-08 ENCOUNTER — Telehealth: Payer: Self-pay | Admitting: Rheumatology

## 2017-11-08 NOTE — Telephone Encounter (Signed)
Patient called requesting her labwork orders be sent to [facility] in [place].  Patient states she plans to go either this afternoon or Monday.

## 2017-11-08 NOTE — Telephone Encounter (Signed)
Left message to advise patient she could have her labs drawn in our office as she has not been in the office in the last couple years.

## 2017-11-12 NOTE — Telephone Encounter (Signed)
Received a fax generated from cover my meds regarding a prior authorization for Enbrel SureClick. Patient must make a follow up appointment and lab work before it can be completed per Seth Bake, LPN. *see previous note*  Called pt to update. Left message.  Erika King, [place], CPhT 9:52 AM

## 2017-11-13 ENCOUNTER — Telehealth: Payer: Self-pay

## 2017-11-13 ENCOUNTER — Encounter: Payer: Self-pay | Admitting: Rheumatology

## 2017-11-13 ENCOUNTER — Ambulatory Visit: Payer: 59 | Admitting: Rheumatology

## 2017-11-13 ENCOUNTER — Encounter (INDEPENDENT_AMBULATORY_CARE_PROVIDER_SITE_OTHER): Payer: Self-pay

## 2017-11-13 VITALS — BP 121/76 | HR 82 | Resp 15 | Ht 66.0 in | Wt 202.0 lb

## 2017-11-13 DIAGNOSIS — M0579 Rheumatoid arthritis with rheumatoid factor of multiple sites without organ or systems involvement: Secondary | ICD-10-CM

## 2017-11-13 DIAGNOSIS — Z8639 Personal history of other endocrine, nutritional and metabolic disease: Secondary | ICD-10-CM

## 2017-11-13 DIAGNOSIS — Z8709 Personal history of other diseases of the respiratory system: Secondary | ICD-10-CM

## 2017-11-13 DIAGNOSIS — G5602 Carpal tunnel syndrome, left upper limb: Secondary | ICD-10-CM | POA: Diagnosis not present

## 2017-11-13 DIAGNOSIS — Z79899 Other long term (current) drug therapy: Secondary | ICD-10-CM

## 2017-11-13 MED ORDER — ETANERCEPT 50 MG/ML ~~LOC~~ SOAJ: 50.0000 mg | SUBCUTANEOUS | 0 refills | Status: DC

## 2017-11-13 NOTE — Telephone Encounter (Signed)
A prior authorization for Enbrel Sureclick has been submitted to pts insurance via cover my meds. Will update once we have a response.   Leroy Trim, [place], CPhT 1:51 PM

## 2017-11-14 NOTE — Telephone Encounter (Signed)
Received a fax from [employer] regarding a prior authorization approval for ENBREL SURECLICK from 0/10/5995 to 11/14/2018.   Reference number:[id number] Phone number:[phone]  Will send document to scan center.  Called pt to update. Left message.  Mykaila Blunck, [place], CPhT 3:07 PM

## 2017-11-15 LAB — COMPLETE METABOLIC PANEL WITH GFR
AG Ratio: 1.4 (calc) (ref 1.0–2.5)
ALBUMIN MSPROF: 4.2 g/dL (ref 3.6–5.1)
ALT: 16 U/L (ref 6–29)
AST: 17 U/L (ref 10–30)
Alkaline phosphatase (APISO): 34 U/L (ref 33–115)
BUN: 12 mg/dL (ref 7–25)
CALCIUM: 9.8 mg/dL (ref 8.6–10.2)
CO2: 27 mmol/L (ref 20–32)
CREATININE: 1 mg/dL (ref 0.50–1.10)
Chloride: 105 mmol/L (ref 98–110)
GFR, EST AFRICAN AMERICAN: 84 mL/min/{1.73_m2} (ref >=60)
GFR, EST NON AFRICAN AMERICAN: 72 mL/min/{1.73_m2} (ref >=60)
GLOBULIN: 3 g/dL (ref 1.9–3.7)
Glucose, Bld: 91 mg/dL (ref 65–99)
Potassium: 3.9 mmol/L (ref 3.5–5.3)
SODIUM: 139 mmol/L (ref 135–146)
TOTAL PROTEIN: 7.2 g/dL (ref 6.1–8.1)
Total Bilirubin: 0.6 mg/dL (ref 0.2–1.2)

## 2017-11-15 LAB — CBC WITH DIFFERENTIAL/PLATELET
BASOS PCT: 0.8 %
Basophils Absolute: 39 cells/uL (ref 0–200)
EOS ABS: 309 {cells}/uL (ref 15–500)
EOS PCT: 6.3 %
HCT: 37.4 % (ref 35.0–45.0)
Hemoglobin: 12.7 g/dL (ref 11.7–15.5)
Lymphs Abs: 1818 cells/uL (ref 850–3900)
MCH: 31.8 pg (ref 27.0–33.0)
MCHC: 34 g/dL (ref 32.0–36.0)
MCV: 93.7 fL (ref 80.0–100.0)
MONOS PCT: 8.4 %
MPV: 10.2 fL (ref 7.5–12.5)
NEUTROS ABS: 2323 {cells}/uL (ref 1500–7800)
Neutrophils Relative %: 47.4 %
Platelets: 250 10*3/uL (ref 140–400)
RBC: 3.99 10*6/uL (ref 3.80–5.10)
RDW: 11.7 % (ref 11.0–15.0)
Total Lymphocyte: 37.1 %
WBC mixed population: 412 cells/uL (ref 200–950)
WBC: 4.9 10*3/uL (ref 3.8–10.8)

## 2017-11-15 LAB — QUANTIFERON-TB GOLD PLUS
Mitogen-NIL: >10 IU/mL
NIL: 0.02 [IU]/mL
QuantiFERON-TB Gold Plus: NEGATIVE
TB1-NIL: 0 IU/mL
TB2-NIL: <0 IU/mL

## 2017-11-15 LAB — SEDIMENTATION RATE: SED RATE: 11 mm/h (ref 0–20)

## 2017-11-15 LAB — RHEUMATOID FACTOR: Rhuematoid fact SerPl-aCnc: 15 IU/mL — ABNORMAL HIGH (ref <14)

## 2017-11-15 LAB — CYCLIC CITRUL PEPTIDE ANTIBODY, IGG

## 2017-11-18 NOTE — Progress Notes (Signed)
RF and CCP are positive.  She is on Enbrel once weekly for management of rheumatoid arthritis.  All other labs are WNL.

## 2018-01-09 ENCOUNTER — Encounter (HOSPITAL_BASED_OUTPATIENT_CLINIC_OR_DEPARTMENT_OTHER): Payer: Self-pay | Admitting: *Deleted

## 2018-01-09 ENCOUNTER — Emergency Department (HOSPITAL_BASED_OUTPATIENT_CLINIC_OR_DEPARTMENT_OTHER): Payer: 59

## 2018-01-09 ENCOUNTER — Other Ambulatory Visit: Payer: Self-pay

## 2018-01-09 ENCOUNTER — Emergency Department (HOSPITAL_BASED_OUTPATIENT_CLINIC_OR_DEPARTMENT_OTHER)
Admission: EM | Admit: 2018-01-09 | Discharge: 2018-01-09 | Disposition: A | Discharge status: Home / Self Care | Payer: 59 | Attending: Emergency Medicine | Admitting: Emergency Medicine

## 2018-01-09 DIAGNOSIS — E039 Hypothyroidism, unspecified: Secondary | ICD-10-CM | POA: Diagnosis not present

## 2018-01-09 DIAGNOSIS — S161XXA Strain of muscle, fascia and tendon at neck level, initial encounter: Secondary | ICD-10-CM

## 2018-01-09 DIAGNOSIS — Y92410 Unspecified street and highway as the place of occurrence of the external cause: Secondary | ICD-10-CM | POA: Diagnosis not present

## 2018-01-09 DIAGNOSIS — Z79899 Other long term (current) drug therapy: Secondary | ICD-10-CM | POA: Insufficient documentation

## 2018-01-09 DIAGNOSIS — M546 Pain in thoracic spine: Secondary | ICD-10-CM

## 2018-01-09 DIAGNOSIS — Y9389 Activity, other specified: Secondary | ICD-10-CM | POA: Insufficient documentation

## 2018-01-09 DIAGNOSIS — J45909 Unspecified asthma, uncomplicated: Secondary | ICD-10-CM | POA: Insufficient documentation

## 2018-01-09 DIAGNOSIS — Y999 Unspecified external cause status: Secondary | ICD-10-CM | POA: Insufficient documentation

## 2018-01-09 DIAGNOSIS — S199XXA Unspecified injury of neck, initial encounter: Secondary | ICD-10-CM | POA: Diagnosis present

## 2018-01-09 MED ORDER — METHOCARBAMOL 500 MG PO TABS: 500.0000 mg | ORAL_TABLET | Freq: Two times a day (BID) | ORAL | 0 refills | Status: DC
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
## 2018-01-09 NOTE — Discharge Instructions (Signed)

## 2018-02-19 ENCOUNTER — Encounter: Payer: 59 | Admitting: Podiatry

## 2018-02-21 ENCOUNTER — Telehealth: Payer: Self-pay | Admitting: *Deleted

## 2018-02-21 NOTE — Telephone Encounter (Signed)
"  Hi Mrs. Wei Newbrough, if you could, give me a call back.  I may need to cancel this appointment but I do need to make sure if I do or if I do not.  Thanks again."

## 2018-02-27 NOTE — Telephone Encounter (Signed)
I know I want to have surgery in December.  Should I wait until it gets closer to December or is it okay to do that now?"  You can go ahead and do it now.  "I'll call back tomorrow and schedule an appointment with Dr. Paulla Dolly.  Thanks for calling me back."

## 2018-03-14 ENCOUNTER — Other Ambulatory Visit: Payer: Self-pay | Admitting: Obstetrics and Gynecology

## 2018-03-14 DIAGNOSIS — R1011 Right upper quadrant pain: Secondary | ICD-10-CM

## 2018-03-21 ENCOUNTER — Other Ambulatory Visit: Payer: 59

## 2018-03-27 ENCOUNTER — Other Ambulatory Visit: Payer: 59

## 2018-03-30 ENCOUNTER — Other Ambulatory Visit: Payer: Self-pay | Admitting: Physician Assistant

## 2018-04-01 NOTE — Progress Notes (Deleted)
Office Visit Note  Patient: Erika King             Date of Birth: 1980-12-07           MRN: [id number]             PCP: Kathyrn Lass, MD Referring: Kathyrn Lass, MD Visit Date: 04/15/2018 Occupation: @GUAROCC @  Subjective:  No chief complaint on file.   History of Present Illness: Erika King is a 37 y.o. female ***   Activities of Daily Living:  Patient reports morning stiffness for *** {minute/hour:19697}.   Patient {ACTIONS;DENIES/REPORTS:21021675::"Denies"} nocturnal pain.  Difficulty dressing/grooming: {ACTIONS;DENIES/REPORTS:21021675::"Denies"} Difficulty climbing stairs: {ACTIONS;DENIES/REPORTS:21021675::"Denies"} Difficulty getting out of chair: {ACTIONS;DENIES/REPORTS:21021675::"Denies"} Difficulty using hands for taps, buttons, cutlery, and/or writing: {ACTIONS;DENIES/REPORTS:21021675::"Denies"}  No Rheumatology ROS completed.   PMFS History:  Patient Active Problem List   Diagnosis Date Noted  . Rheumatoid arthritis involving multiple sites with positive rheumatoid factor ([place]) 12/14/2016  . Elevated LFTs 12/14/2016  . Left carpal tunnel syndrome 12/14/2016  . History of asthma 12/14/2016  . History of hyperthyroidism 12/14/2016  . Vitamin D deficiency 12/14/2016  . Fibroid, uterine 07/17/2016    Past Medical History:  Diagnosis Date  . Asthma   . Broken arm   . Broken foot   . Environmental and seasonal allergies   . History of radioactive iodine thyroid ablation 09/2015  . Hypothyroidism   . Indigestion   . RA (rheumatoid arthritis) ([place])   . Sinusitis   . Vitamin D deficiency     Family History  Problem Relation Age of Onset  . Pneumonia Mother   . Huntington's disease Mother   . Cancer Father        lung  . Hypertension Sister   . Depression Sister   . Huntington's disease Brother   . Healthy Daughter    Past Surgical History:  Procedure Laterality Date  . MYOMECTOMY N/A 07/17/2016   Procedure: MYOMECTOMY;  Surgeon:  Everett Graff, MD;  Location: [facility];  Service: Gynecology;  Laterality: N/A;  2.5 hours  . ROOT CANAL    . TOE SURGERY    . UMBILICAL HERNIA REPAIR N/A 07/17/2016   Procedure: HERNIA REPAIR UMBILICAL ADULT;  Surgeon: Fanny Skates, MD;  Location: [facility];  Service: General;  Laterality: N/A;  Insertion of mesh   Social History   Social History Narrative  . Not on file    Objective: Vital Signs: There were no vitals taken for this visit.   Physical Exam   Musculoskeletal Exam: ***  CDAI Exam: CDAI Score: Not documented Patient Global Assessment: Not documented; Provider Global Assessment: Not documented Swollen: Not documented; Tender: Not documented Joint Exam   Not documented   There is currently no information documented on the homunculus. Go to the Rheumatology activity and complete the homunculus joint exam.  Investigation: No additional findings.  Imaging: No results found.  Recent Labs: Lab Results  Component Value Date   WBC 4.9 11/13/2017   HGB 12.7 11/13/2017   PLT 250 11/13/2017   NA 139 11/13/2017   K 3.9 11/13/2017   CL 105 11/13/2017   CO2 27 11/13/2017   GLUCOSE 91 11/13/2017   BUN 12 11/13/2017   CREATININE 1.00 11/13/2017   BILITOT 0.6 11/13/2017   ALKPHOS 30 (L) 07/18/2016   AST 17 11/13/2017   ALT 16 11/13/2017   PROT 7.2 11/13/2017   ALBUMIN 3.1 (L) 07/18/2016   CALCIUM 9.8 11/13/2017   GFRAA 84 11/13/2017   QFTBGOLDPLUS NEGATIVE  11/13/2017    Speciality Comments: No specialty comments available.  Procedures:  No procedures performed Allergies: Black walnut pollen allergy skin test; Molds & smuts; Other; and Peanut oil   Assessment / Plan:     Visit Diagnoses: No diagnosis found.   Orders: No orders of the defined types were placed in this encounter.  No orders of the defined types were placed in this encounter.   Face-to-face time spent with patient was *** minutes. Greater than 50% of time was spent in counseling and  coordination of care.  Follow-Up Instructions: No follow-ups on file.   Earnestine Mealing, CMA  Note - This record has been created using Editor, commissioning.  Chart creation errors have been sought, but may not always  have been located. Such creation errors do not reflect on  the standard of medical care.

## 2018-04-01 NOTE — Telephone Encounter (Addendum)
Last Visit: 11/13/17 Next Visit: 04/15/18 Labs: 11/13/17 cbc/cmp wnl TB Gold: 11/13/17 neg   Left message to advise patient she is due for labs.   Okay to refill 30 day per Dr. Estanislado Pandy

## 2018-04-07 NOTE — Progress Notes (Signed)
This encounter was created in error - please disregard.

## 2018-04-14 ENCOUNTER — Ambulatory Visit
Admission: RE | Admit: 2018-04-14 | Discharge: 2018-04-14 | Disposition: A | Discharge status: Home / Self Care | Payer: 59 | Source: Ambulatory Visit | Attending: Obstetrics and Gynecology | Admitting: Obstetrics and Gynecology

## 2018-04-14 DIAGNOSIS — R1011 Right upper quadrant pain: Secondary | ICD-10-CM

## 2018-04-14 MED ORDER — IOPAMIDOL (ISOVUE-300) INJECTION 61%
100.0000 mL | Freq: Once | INTRAVENOUS | Status: AC | PRN
  Administered 2018-04-14: 100 mL via INTRAVENOUS

## 2018-04-15 ENCOUNTER — Ambulatory Visit: Payer: 59 | Admitting: Rheumatology

## 2018-05-30 DIAGNOSIS — Z79899 Other long term (current) drug therapy: Secondary | ICD-10-CM | POA: Insufficient documentation

## 2018-05-30 NOTE — Progress Notes (Signed)
Office Visit Note  Patient: Erika King             Date of Birth: 06-28-1981           MRN: [id number]             PCP: Kathyrn Lass, MD Referring: Kathyrn Lass, MD Visit Date: 06/02/2018 Occupation: @GUAROCC @  Subjective:  Left 4th MCP joint pain   History of Present Illness: Erika King is a 37 y.o. female with history of seropositive rheumatoid arthritis.  She is on Enbrel 50 mg sq injections once every 2-3 weeks. Patient reports that her last enbrel injection was yesterday.  She states she had the flu last week and was treated with tamiflu.  She reports she has some residual congestion, but denies any fevers or cough at this time.  She states she has been having left 4th MCP joint for the past 1 week. She describes it as a aching and tenderness.  She denies any joint swelling.  She denies any triggering or locking.  She reports the pain has been waking her up at night.  She takes ibuprofen for pain relief and uses Aspercreme topically.  She denies any other joint pain or joint swelling at this time.     Activities of Daily Living:  Patient reports morning stiffness for 10 minutes.   Patient Reports nocturnal pain.  Difficulty dressing/grooming: Denies Difficulty climbing stairs: Denies Difficulty getting out of chair: Denies Difficulty using hands for taps, buttons, cutlery, and/or writing: Denies  Review of Systems  Constitutional: Negative for fatigue.  HENT: Positive for mouth dryness. Negative for mouth sores and nose dryness.   Eyes: Negative for pain, visual disturbance and dryness.  Respiratory: Negative for cough, hemoptysis, shortness of breath and difficulty breathing.   Cardiovascular: Negative for chest pain, palpitations, hypertension and swelling in legs/feet.  Gastrointestinal: Negative for blood in stool, constipation and diarrhea.  Endocrine: Negative for increased urination.  Genitourinary: Negative for painful urination.  Musculoskeletal:  Positive for arthralgias, joint pain and morning stiffness. Negative for joint swelling, myalgias, muscle weakness, muscle tenderness and myalgias.  Skin: Negative for color change, pallor, rash, hair loss, nodules/bumps, skin tightness, ulcers and sensitivity to sunlight.  Allergic/Immunologic: Negative for susceptible to infections.  Neurological: Negative for dizziness, numbness, headaches and weakness.  Hematological: Negative for swollen glands.  Psychiatric/Behavioral: Negative for depressed mood and sleep disturbance. The patient is nervous/anxious.     PMFS History:  Patient Active Problem List   Diagnosis Date Noted  . High risk medication use 05/30/2018  . Rheumatoid arthritis involving multiple sites with positive rheumatoid factor ([place]) 12/14/2016  . Elevated LFTs 12/14/2016  . Left carpal tunnel syndrome 12/14/2016  . History of asthma 12/14/2016  . History of hyperthyroidism 12/14/2016  . Vitamin D deficiency 12/14/2016  . Fibroid, uterine 07/17/2016    Past Medical History:  Diagnosis Date  . Asthma   . Broken arm   . Broken foot   . Environmental and seasonal allergies   . History of radioactive iodine thyroid ablation 09/2015  . Hypothyroidism   . Indigestion   . RA (rheumatoid arthritis) ([place])   . Sinusitis   . Vitamin D deficiency     Family History  Problem Relation Age of Onset  . Pneumonia Mother   . Huntington's disease Mother   . Cancer Father        lung  . Hypertension Sister   . Depression Sister   . Huntington's disease Brother   .  Healthy Daughter    Past Surgical History:  Procedure Laterality Date  . MYOMECTOMY N/A 07/17/2016   Procedure: MYOMECTOMY;  Surgeon: Everett Graff, MD;  Location: [facility];  Service: Gynecology;  Laterality: N/A;  2.5 hours  . ROOT CANAL    . TOE SURGERY    . UMBILICAL HERNIA REPAIR N/A 07/17/2016   Procedure: HERNIA REPAIR UMBILICAL ADULT;  Surgeon: Fanny Skates, MD;  Location: [facility];  Service: General;   Laterality: N/A;  Insertion of mesh   Social History   Social History Narrative  . Not on file    Objective: Vital Signs: BP 123/79 (BP Location: Left Arm, Patient Position: Sitting, Cuff Size: Normal)   Pulse 65   Resp 14   Ht 5' 5.5" (1.664 m)   Wt 193 lb (87.5 kg)   BMI 31.63 kg/m    Physical Exam  Constitutional: She is oriented to person, place, and time. She appears well-developed and well-nourished.  HENT:  Head: Normocephalic and atraumatic.  Eyes: Conjunctivae and EOM are normal.  Neck: Normal range of motion.  Cardiovascular: Normal rate, regular rhythm, normal heart sounds and intact distal pulses.  Pulmonary/Chest: Effort normal and breath sounds normal.  Abdominal: Soft. Bowel sounds are normal.  Lymphadenopathy:    She has no cervical adenopathy.  Neurological: She is alert and oriented to person, place, and time.  Skin: Skin is warm and dry. Capillary refill takes less than 2 seconds.  Psychiatric: She has a normal mood and affect. Her behavior is normal.  Nursing note and vitals reviewed.    Musculoskeletal Exam: C-spine, thoracic spine, and lumbar spine good ROM. No midline spinal tenderness.  No SI joint tenderness.  Shoulder joints, elbow joints, wrist joints, MCPs, PIPs, and DIPs good ROM with no synovitis.  Left 4th MCP joint tenderness.  Complete fist formation bilaterally.  Hip joints, knee joints, ankle joints, MTPs, PIPs, and DIPs good ROM with no synovitis.  No warmth or effusion of knee joints.  No tenderness or swelling of ankle joints.  No tenderness of MTPs.  No tenderness of trochanteric bursa bilaterally.    CDAI Exam: CDAI Score: 1.4  Patient Global Assessment: 2 (mm); Provider Global Assessment: 2 (mm) Swollen: 0 ; Tender: 1  Joint Exam      Right  Left  MCP 4      Tender     Investigation: No additional findings.  Imaging: No results found.  Recent Labs: Lab Results  Component Value Date   WBC 4.9 11/13/2017   HGB 12.7  11/13/2017   PLT 250 11/13/2017   NA 139 11/13/2017   K 3.9 11/13/2017   CL 105 11/13/2017   CO2 27 11/13/2017   GLUCOSE 91 11/13/2017   BUN 12 11/13/2017   CREATININE 1.00 11/13/2017   BILITOT 0.6 11/13/2017   ALKPHOS 30 (L) 07/18/2016   AST 17 11/13/2017   ALT 16 11/13/2017   PROT 7.2 11/13/2017   ALBUMIN 3.1 (L) 07/18/2016   CALCIUM 9.8 11/13/2017   GFRAA 84 11/13/2017   QFTBGOLDPLUS NEGATIVE 11/13/2017    Speciality Comments: No specialty comments available.  Procedures:  No procedures performed Allergies: Black walnut pollen allergy skin test; Molds & smuts; Other; and Peanut oil   Assessment / Plan:     Visit Diagnoses: Rheumatoid arthritis involving multiple sites with positive rheumatoid factor (HCC) - +CCP, +RF: She has left 4th MCP joint tenderness but no synovitis.  She has had tenderness of left 4th MCP joint for the past  1 week. She has been taking ibuprofen PRN and using Aspercreme topically PRN. She has no other joint pain or joint swelling at this time. She had the flu last week, and she was treated with tamiflu. Her most recent Enbrel injection was yesterday. She has minor sinus congestion, but she has no fevers or cough at this time.  She was encouraged to hold Enbrel if she has an infection. She has been injecting Enbrel every 2-3 weeks.  We discussed the importance of staying compliant and injecting Enbrel 50 mg sq once weekly if she does not have signs or symptoms of an infection..  A refill of Enbrel was sent to the pharmacy. She will follow up in the office in 5 months. She was advised to notify us if she develops increased joint pain or joint swelling.   High risk medication use - Enbrel 50mg /ml weekly. inadequate response to MTX in the past. TB gold negative on 11/13/17.  CBC and CMP will be drawn today to monitor for drug toxicity.  She will return in February and every 3 months for lab work.  Standing orders placed. - Plan: CBC with Differential/Platelet,  COMPLETE METABOLIC PANEL WITH GFR, CBC with Differential/Platelet, COMPLETE METABOLIC PANEL WITH GFR  Elevated LFTs: LFTs WNL on 11/13/17.  We are rechecking LFTs today.   Vitamin D deficiency -She requested vitamin D to be checked today. Plan: VITAMIN D 25 Hydroxy (Vit-D Deficiency, Fractures)  Other medical conditions are listed as follows:   History of hyperthyroidism  History of asthma   Orders: Orders Placed This Encounter  Procedures  . CBC with Differential/Platelet  . COMPLETE METABOLIC PANEL WITH GFR  . CBC with Differential/Platelet  . COMPLETE METABOLIC PANEL WITH GFR  . VITAMIN D 25 Hydroxy (Vit-D Deficiency, Fractures)   Meds ordered this encounter  Medications  . etanercept (ENBREL SURECLICK) 50 MG/ML injection    Sig: INJECT 50MG  SUBCUTANEOUSLY EVERY WEEK    Dispense:  3.92 mL    Refill:  0    Face-to-face time spent with patient was 30 minutes. Greater than 50% of time was spent in counseling and coordination of care.  Follow-Up Instructions: Return for Rheumatoid arthritis.   Ofilia Neas, PA-C  I examined and evaluated the patient with Hazel Sams PA.  Patient has some joint tenderness but no synovitis on examination today.  I encouraged her to take Enbrel once a week for right now until her joint symptoms improve and then she can space it again to every other week.  The plan of care was discussed as noted above.  Bo Merino, MD Note - This record has been created using Editor, commissioning.  Chart creation errors have been sought, but may not always  have been located. Such creation errors do not reflect on  the standard of medical care.

## 2018-06-02 ENCOUNTER — Other Ambulatory Visit: Payer: Self-pay | Admitting: Rheumatology

## 2018-06-02 ENCOUNTER — Ambulatory Visit (INDEPENDENT_AMBULATORY_CARE_PROVIDER_SITE_OTHER): Payer: 59 | Admitting: Physician Assistant

## 2018-06-02 ENCOUNTER — Encounter: Payer: Self-pay | Admitting: Physician Assistant

## 2018-06-02 VITALS — BP 123/79 | HR 65 | Resp 14 | Ht 65.5 in | Wt 193.0 lb

## 2018-06-02 DIAGNOSIS — Z79899 Other long term (current) drug therapy: Secondary | ICD-10-CM

## 2018-06-02 DIAGNOSIS — E559 Vitamin D deficiency, unspecified: Secondary | ICD-10-CM | POA: Diagnosis not present

## 2018-06-02 DIAGNOSIS — Z8709 Personal history of other diseases of the respiratory system: Secondary | ICD-10-CM

## 2018-06-02 DIAGNOSIS — Z8639 Personal history of other endocrine, nutritional and metabolic disease: Secondary | ICD-10-CM

## 2018-06-02 DIAGNOSIS — M0579 Rheumatoid arthritis with rheumatoid factor of multiple sites without organ or systems involvement: Secondary | ICD-10-CM | POA: Diagnosis not present

## 2018-06-02 DIAGNOSIS — R945 Abnormal results of liver function studies: Secondary | ICD-10-CM

## 2018-06-02 DIAGNOSIS — R7989 Other specified abnormal findings of blood chemistry: Secondary | ICD-10-CM

## 2018-06-02 MED ORDER — ETANERCEPT 50 MG/ML ~~LOC~~ SOAJ: SUBCUTANEOUS | 0 refills | Status: DC

## 2018-06-02 NOTE — Patient Instructions (Signed)
Standing Labs We placed an order today for your standing lab work.    Please come back and get your standing labs in February and every 3 months  We have open lab Monday through Friday from 8:30-11:30 AM and 1:30-4:00 PM  at the office of Dr. Shaili Deveshwar.   You may experience shorter wait times on Monday and Friday afternoons. The office is located at [address], [address], [place], [place] [address] No appointment is necessary.   Labs are drawn by [facility].  You may receive a bill from [facility] for your lab work. If you have any questions regarding directions or hours of operation,  please call [phone].   Just as a reminder please drink plenty of water prior to coming for your lab work. Thanks!  

## 2018-06-03 LAB — CBC WITH DIFFERENTIAL/PLATELET
BASOS PCT: 0.7 %
Basophils Absolute: 29 cells/uL (ref 0–200)
EOS ABS: 302 {cells}/uL (ref 15–500)
Eosinophils Relative: 7.2 %
HCT: 36.3 % (ref 35.0–45.0)
HEMOGLOBIN: 12.1 g/dL (ref 11.7–15.5)
Lymphs Abs: 2041 cells/uL (ref 850–3900)
MCH: 31.7 pg (ref 27.0–33.0)
MCHC: 33.3 g/dL (ref 32.0–36.0)
MCV: 95 fL (ref 80.0–100.0)
MONOS PCT: 8.1 %
MPV: 10.2 fL (ref 7.5–12.5)
Neutro Abs: 1487 cells/uL — ABNORMAL LOW (ref 1500–7800)
Neutrophils Relative %: 35.4 %
PLATELETS: 288 10*3/uL (ref 140–400)
RBC: 3.82 10*6/uL (ref 3.80–5.10)
RDW: 11.9 % (ref 11.0–15.0)
TOTAL LYMPHOCYTE: 48.6 %
WBC mixed population: 340 cells/uL (ref 200–950)
WBC: 4.2 10*3/uL (ref 3.8–10.8)

## 2018-06-03 LAB — COMPLETE METABOLIC PANEL WITH GFR
AG Ratio: 1.2 (calc) (ref 1.0–2.5)
ALT: 15 U/L (ref 6–29)
AST: 19 U/L (ref 10–30)
Albumin: 3.9 g/dL (ref 3.6–5.1)
Alkaline phosphatase (APISO): 33 U/L (ref 33–115)
BUN: 10 mg/dL (ref 7–25)
CALCIUM: 9.8 mg/dL (ref 8.6–10.2)
CO2: 28 mmol/L (ref 20–32)
CREATININE: 0.88 mg/dL (ref 0.50–1.10)
Chloride: 105 mmol/L (ref 98–110)
GFR, EST AFRICAN AMERICAN: 97 mL/min/{1.73_m2} (ref >=60)
GFR, EST NON AFRICAN AMERICAN: 84 mL/min/{1.73_m2} (ref >=60)
GLOBULIN: 3.3 g/dL (ref 1.9–3.7)
Glucose, Bld: 82 mg/dL (ref 65–99)
Potassium: 4.2 mmol/L (ref 3.5–5.3)
Sodium: 138 mmol/L (ref 135–146)
TOTAL PROTEIN: 7.2 g/dL (ref 6.1–8.1)
Total Bilirubin: 0.4 mg/dL (ref 0.2–1.2)

## 2018-06-03 LAB — VITAMIN D 25 HYDROXY (VIT D DEFICIENCY, FRACTURES): Vit D, 25-Hydroxy: 31 ng/mL (ref 30–100)

## 2018-06-03 NOTE — Progress Notes (Signed)
Vitamin D is 31.  Please recommend a maintenance dose of vitamin D.

## 2018-06-03 NOTE — Progress Notes (Signed)
Labs are WNL.

## 2018-06-04 NOTE — Progress Notes (Signed)
Ok to send in Vitamin D 50,000 units by mouth once monthly.

## 2018-06-05 ENCOUNTER — Telehealth: Payer: Self-pay | Admitting: *Deleted

## 2018-06-05 MED ORDER — VITAMIN D (ERGOCALCIFEROL) 1.25 MG (50000 UNIT) PO CAPS: 50000.0000 [IU] | ORAL_CAPSULE | ORAL | 1 refills | Status: DC

## 2018-06-05 NOTE — Telephone Encounter (Signed)
-----   Message from Ofilia Neas, PA-C sent at 06/04/2018  4:11 PM EST ----- Ok to send in Vitamin D 50,000 units by mouth once monthly.

## 2018-07-08 ENCOUNTER — Telehealth: Payer: Self-pay | Admitting: Rheumatology

## 2018-07-08 NOTE — Telephone Encounter (Signed)
Attempted to contact the patient to asked of she would like to try a prednisone taper. Left message for patient to call the office.

## 2018-07-08 NOTE — Telephone Encounter (Signed)
Patient states she is having trouble with having pain in the ring finger on the left hand and the right wrist. Patient states she is not having swelling. Patient states the pain is worse first thing in the morning and at night. Patient states this has been going on about 3-4 weeks for her finger and 3-4 days for her wrist. Patients denies any redness or warmth to the area. Patient states she does type her clinical notes and text but denies any injuries to the areas.

## 2018-07-08 NOTE — Telephone Encounter (Signed)
Per patient, [facility] needs doctor for PA's rx on Enbrel. Please call to advise. Also, patient states finger on rt hand is still hurting with meds. Wrist seems to be hurting now too. Please call to advise.

## 2018-07-09 MED ORDER — PREDNISONE 5 MG PO TABS: ORAL_TABLET | ORAL | 0 refills | Status: DC

## 2018-07-09 NOTE — Addendum Note (Signed)
Addended by: Carole Binning on: 07/09/2018 04:53 PM   Modules accepted: Orders

## 2018-07-09 NOTE — Telephone Encounter (Signed)
Please call in prednisone 5 mg tablets, starting it 20 mg and taper by 5 mg every 4 days.

## 2018-08-01 ENCOUNTER — Other Ambulatory Visit: Payer: Self-pay | Admitting: Physician Assistant

## 2018-08-01 NOTE — Telephone Encounter (Signed)
Last Visit: 06/02/18 Next Visit due March 2020. Message sent to the front to schedule patient. Labs: 06/02/18 WNL TB Gold: 11/13/17 Neg   Okay to refill per Dr. Estanislado Pandy

## 2018-08-01 NOTE — Telephone Encounter (Signed)
Please schedule patient for a follow-up visit.

## 2018-08-15 ENCOUNTER — Telehealth: Payer: Self-pay | Admitting: *Deleted

## 2018-08-15 NOTE — Telephone Encounter (Signed)
Patient advised we received fax from [facility] that they have made attempts to contact her. Patient states she has not received any phone calls from [facility]. Provided contact number.

## 2018-08-15 NOTE — Telephone Encounter (Signed)
[facility] sent fax to office stating they have made multiple attempts to contact the patient to fill Enbrel [facility] .They have been unable to reach patient. Contact number [phone]

## 2018-10-22 ENCOUNTER — Telehealth: Payer: Self-pay | Admitting: *Deleted

## 2018-10-22 NOTE — Telephone Encounter (Signed)
Received a fax from [facility] regarding a prior authorization for ENBREL. Authorization has been APPROVED through 10/16/2019.   Will send document to scan center.

## 2018-11-10 ENCOUNTER — Other Ambulatory Visit: Payer: Self-pay

## 2018-11-10 DIAGNOSIS — M0579 Rheumatoid arthritis with rheumatoid factor of multiple sites without organ or systems involvement: Secondary | ICD-10-CM

## 2018-11-10 DIAGNOSIS — Z79899 Other long term (current) drug therapy: Secondary | ICD-10-CM

## 2018-11-14 NOTE — Progress Notes (Deleted)
Virtual Visit via Telephone Note  I connected with Erika King on 11/14/18 at 10:45 AM EDT by telephone and verified that I am speaking with the correct person using two identifiers.   I discussed the limitations, risks, security and privacy concerns of performing an evaluation and management service by telephone and the availability of in person appointments. I also discussed with the patient that there may be a patient responsible charge related to this service. The patient expressed understanding and agreed to proceed.  CC:  History of Present Illness: Patient is a 38 year old female with a past medical history of seropositive rheumatoid arthritis.  She is on Enbrel 50 mg sq injections every 2-3 weeks.   Review of Systems  Constitutional: Negative for fever and malaise/fatigue.  Eyes: Negative for photophobia, pain, discharge and redness.  Respiratory: Negative for cough, shortness of breath and wheezing.   Cardiovascular: Negative for chest pain and palpitations.  Gastrointestinal: Negative for blood in stool, constipation and diarrhea.  Genitourinary: Negative for dysuria.  Musculoskeletal: Negative for back pain, joint pain, myalgias and neck pain.  Skin: Negative for rash.  Neurological: Negative for dizziness and headaches.  Psychiatric/Behavioral: Negative for depression. The patient is not nervous/anxious and does not have insomnia.      Observations/Objective: Physical Exam  Constitutional: She is oriented to person, place, and time.  Neurological: She is alert and oriented to person, place, and time.  Psychiatric: Mood, memory, affect and judgment normal.   Patient reports morning stiffness for  {minute/hour:19697}.   Patient {Actions; denies-reports:120008} nocturnal pain.  Difficulty dressing/grooming: {ACTIONS;DENIES/REPORTS:21021675::"Denies"} Difficulty climbing stairs: {ACTIONS;DENIES/REPORTS:21021675::"Denies"} Difficulty getting out of chair:  {ACTIONS;DENIES/REPORTS:21021675::"Denies"} Difficulty using hands for taps, buttons, cutlery, and/or writing: {ACTIONS;DENIES/REPORTS:21021675::"Denies"}  Assessment and Plan: Rheumatoid arthritis involving multiple sites with positive rheumatoid factor (HCC) - +CCP, +RF:   High risk medication use - Enbrel 50mg /ml every 2-3 weeks. inadequate response to MTX in the past. TB gold negative on 11/13/17.    Elevated LFTs:  Vitamin D deficiency -  Follow Up Instructions: She will follow up in    I discussed the assessment and treatment plan with the patient. The patient was provided an opportunity to ask questions and all were answered. The patient agreed with the plan and demonstrated an understanding of the instructions.   The patient was advised to call back or seek an in-person evaluation if the symptoms worsen or if the condition fails to improve as anticipated.  I provided *** minutes of non-face-to-face time during this encounter.   Ofilia Neas, PA-C

## 2018-11-19 ENCOUNTER — Telehealth: Payer: 59 | Admitting: Rheumatology

## 2018-12-03 ENCOUNTER — Other Ambulatory Visit: Payer: Self-pay | Admitting: Physician Assistant

## 2018-12-03 NOTE — Telephone Encounter (Signed)
Last Visit: 06/02/2018 Next Visit: 12/31/2018  Okay to refill per Dr. Estanislado Pandy.

## 2018-12-17 NOTE — Progress Notes (Signed)
Office Visit Note  Patient: Erika King             Date of Birth: 20-Oct-1980           MRN: [id number]             PCP: Kathyrn Lass, MD Referring: Kathyrn Lass, MD Visit Date: 12/31/2018 Occupation: @GUAROCC @  Subjective:  Pain in both knee joints    History of Present Illness: Erika King is a 38 y.o. female with history of seropositive rheumatoid arthritis.  She is on Enbrel 50 mg sq once month injections.   She has occasional bilateral knee joint pain.  She denies any joint swelling or warmth of knee joints. She denies any joint stiffness.  She states her knee joints feel "heavy" at times. She has been walking for exercise recently.  She denies any other joint pain or joint swelling.    Activities of Daily Living:  Patient reports morning stiffness for 0 minutes.   Patient Denies nocturnal pain.  Difficulty dressing/grooming: Denies Difficulty climbing stairs: Denies Difficulty getting out of chair: Denies Difficulty using hands for taps, buttons, cutlery, and/or writing: Denies  Review of Systems  Constitutional: Negative for fatigue.  HENT: Negative for mouth sores, mouth dryness and nose dryness.   Eyes: Negative for pain, visual disturbance and dryness.  Respiratory: Negative for cough, hemoptysis, shortness of breath and difficulty breathing.   Cardiovascular: Negative for chest pain, palpitations, hypertension and swelling in legs/feet.  Gastrointestinal: Negative for blood in stool, constipation and diarrhea.  Endocrine: Negative for increased urination.  Genitourinary: Negative for painful urination.  Musculoskeletal: Positive for arthralgias and joint pain. Negative for joint swelling, myalgias, muscle weakness, morning stiffness, muscle tenderness and myalgias.  Skin: Negative for color change, pallor, rash, hair loss, nodules/bumps, skin tightness, ulcers and sensitivity to sunlight.  Allergic/Immunologic: Negative for susceptible to infections.   Neurological: Negative for dizziness, numbness, headaches and weakness.  Hematological: Negative for swollen glands.  Psychiatric/Behavioral: Negative for depressed mood and sleep disturbance. The patient is not nervous/anxious.     PMFS History:  Patient Active Problem List   Diagnosis Date Noted  . High risk medication use 05/30/2018  . Rheumatoid arthritis involving multiple sites with positive rheumatoid factor ([place]) 12/14/2016  . Elevated LFTs 12/14/2016  . Left carpal tunnel syndrome 12/14/2016  . History of asthma 12/14/2016  . History of hyperthyroidism 12/14/2016  . Vitamin D deficiency 12/14/2016  . Fibroid, uterine 07/17/2016    Past Medical History:  Diagnosis Date  . Asthma   . Broken arm   . Broken foot   . Environmental and seasonal allergies   . History of radioactive iodine thyroid ablation 09/2015  . Hypothyroidism   . Indigestion   . RA (rheumatoid arthritis) ([place])   . Sinusitis   . Vitamin D deficiency     Family History  Problem Relation Age of Onset  . Pneumonia Mother   . Huntington's disease Mother   . Cancer Father        lung  . Hypertension Sister   . Depression Sister   . Huntington's disease Brother   . Healthy Daughter    Past Surgical History:  Procedure Laterality Date  . MYOMECTOMY N/A 07/17/2016   Procedure: MYOMECTOMY;  Surgeon: Everett Graff, MD;  Location: [facility];  Service: Gynecology;  Laterality: N/A;  2.5 hours  . ROOT CANAL    . TOE SURGERY    . UMBILICAL HERNIA REPAIR N/A 07/17/2016   Procedure:  HERNIA REPAIR UMBILICAL ADULT;  Surgeon: Fanny Skates, MD;  Location: [facility];  Service: General;  Laterality: N/A;  Insertion of mesh   Social History   Social History Narrative  . Not on file   Immunization History  Administered Date(s) Administered  . Influenza-Unspecified 04/30/2015  . Tetanus 11/28/2015     Objective: Vital Signs: BP 110/67 (BP Location: Left Arm, Patient Position: Sitting, Cuff Size: Normal)    Pulse 73   Resp 13   Ht 5' 5.5" (1.664 m)   Wt 193 lb (87.5 kg)   BMI 31.63 kg/m    Physical Exam Vitals signs and nursing note reviewed.  Constitutional:      Appearance: She is well-developed.  HENT:     Head: Normocephalic and atraumatic.  Eyes:     Conjunctiva/sclera: Conjunctivae normal.  Neck:     Musculoskeletal: Normal range of motion.  Cardiovascular:     Rate and Rhythm: Normal rate and regular rhythm.     Heart sounds: Normal heart sounds.  Pulmonary:     Effort: Pulmonary effort is normal.     Breath sounds: Normal breath sounds.  Abdominal:     General: Bowel sounds are normal.     Palpations: Abdomen is soft.  Lymphadenopathy:     Cervical: No cervical adenopathy.  Skin:    General: Skin is warm and dry.     Capillary Refill: Capillary refill takes less than 2 seconds.  Neurological:     Mental Status: She is alert and oriented to person, place, and time.  Psychiatric:        Behavior: Behavior normal.      Musculoskeletal Exam: C-spine, thoracic spine, lumbar spine good range of motion.  No midline spinal tenderness.  No SI joint tenderness.  Shoulders, elbows, wrist joints, MCPs, PIPs, DIPs good range of motion no synovitis.  Hip joints, knee joints, ankle joints, MTPs, PIPs, DIPs good range of motion with no synovitis.  No warmth or effusion of bilateral knee joints.  She has bilateral knee crepitus.  No tenderness or swelling of ankle joints.  No tenderness of MTP or PIP joints.  No tenderness over trochanteric bursa bilaterally  CDAI Exam: CDAI Score: 0.2  Patient Global Assessment: 1 (mm); Provider Global Assessment: 1 (mm) Swollen: 0 ; Tender: 0  Joint Exam   Not documented   There is currently no information documented on the homunculus. Go to the Rheumatology activity and complete the homunculus joint exam.  Investigation: No additional findings.  Imaging: No results found.  Recent Labs: Lab Results  Component Value Date   WBC 4.2  06/02/2018   HGB 12.1 06/02/2018   PLT 288 06/02/2018   NA 138 06/02/2018   K 4.2 06/02/2018   CL 105 06/02/2018   CO2 28 06/02/2018   GLUCOSE 82 06/02/2018   BUN 10 06/02/2018   CREATININE 0.88 06/02/2018   BILITOT 0.4 06/02/2018   ALKPHOS 30 (L) 07/18/2016   AST 19 06/02/2018   ALT 15 06/02/2018   PROT 7.2 06/02/2018   ALBUMIN 3.1 (L) 07/18/2016   CALCIUM 9.8 06/02/2018   GFRAA 97 06/02/2018   QFTBGOLDPLUS NEGATIVE 11/13/2017    Speciality Comments: No specialty comments available.  Procedures:  No procedures performed Allergies: Black walnut pollen allergy skin test; Molds & smuts; Other; and Peanut oil   Assessment / Plan:     Visit Diagnoses: Rheumatoid arthritis involving multiple sites with positive rheumatoid factor (HCC) - +CCP, +RF: She has no synovitis on exam.  She is been having intermittent discomfort in bilateral knee joints.  No warmth or effusion of her knee joints was noted on exam.  She has good range of motion with no discomfort.  She has bilateral knee crepitus.  She is been walking more for exercise as well as doing a 28-day challenge.  The importance of joint protection and muscle strengthening was discussed.  She was given a handout of knee exercises to perform at home.  She has no other joint pain or joint swelling at this time.  She does not experience any morning stiffness.  She is clinically doing well on Enbrel 50 mg subcutaneous injections every 30 days.  She would like to continue to space to every month.  She was advised to notify us if she does increase joint pain or joint swelling.  She will follow-up in the office in 5 months.  High risk medication use - Enbrel 50mg /ml weekly. inadequate response to MTX in the past. Most recent CBC/CMP within normal limits on 06/02/2018.  Due for CBC/CMP today and will monitor every 3 months.  Standing orders are in place.  She was given lab orders for CBC and CMP to take with her today.   - Plan: QuantiFERON-TB Gold  Plus, COMPLETE METABOLIC PANEL WITH GFR, CBC with Differential/Platelet  Elevated LFTs: She was given lab orders to take with her today.  Vitamin D deficiency: She is taking vitamin D and calcium supplement.  Other medical conditions are listed as follows:  History of asthma  History of hyperthyroidism  High risk medication use - Enbrel 50mg /ml weeklyinadequate response to MTX in the past - Plan: QuantiFERON-TB Gold Plus, COMPLETE METABOLIC PANEL WITH GFR, CBC with Differential/Platelet   Orders: Orders Placed This Encounter  Procedures  . QuantiFERON-TB Gold Plus   No orders of the defined types were placed in this encounter.     Follow-Up Instructions: Return in about 5 months (around 06/02/2019) for Rheumatoid arthritis.   Ofilia Neas, PA-C   I examined and evaluated the patient with Hazel Sams PA.  Patient had no synovitis on my examination.  The plan of care was discussed as noted above.  Bo Merino, MD  Note - This record has been created using Editor, commissioning.  Chart creation errors have been sought, but may not always  have been located. Such creation errors do not reflect on  the standard of medical care.

## 2018-12-31 ENCOUNTER — Ambulatory Visit: Payer: 59 | Admitting: Rheumatology

## 2018-12-31 ENCOUNTER — Encounter: Payer: Self-pay | Admitting: Rheumatology

## 2018-12-31 ENCOUNTER — Other Ambulatory Visit: Payer: Self-pay

## 2018-12-31 VITALS — BP 110/67 | HR 73 | Resp 13 | Ht 65.5 in | Wt 193.0 lb

## 2018-12-31 DIAGNOSIS — M0579 Rheumatoid arthritis with rheumatoid factor of multiple sites without organ or systems involvement: Secondary | ICD-10-CM

## 2018-12-31 DIAGNOSIS — R7989 Other specified abnormal findings of blood chemistry: Secondary | ICD-10-CM

## 2018-12-31 DIAGNOSIS — Z8639 Personal history of other endocrine, nutritional and metabolic disease: Secondary | ICD-10-CM

## 2018-12-31 DIAGNOSIS — R945 Abnormal results of liver function studies: Secondary | ICD-10-CM

## 2018-12-31 DIAGNOSIS — E559 Vitamin D deficiency, unspecified: Secondary | ICD-10-CM | POA: Diagnosis not present

## 2018-12-31 DIAGNOSIS — Z8709 Personal history of other diseases of the respiratory system: Secondary | ICD-10-CM

## 2018-12-31 DIAGNOSIS — Z79899 Other long term (current) drug therapy: Secondary | ICD-10-CM

## 2018-12-31 NOTE — Patient Instructions (Signed)

## 2019-01-06 ENCOUNTER — Telehealth: Payer: Self-pay

## 2019-01-06 NOTE — Telephone Encounter (Signed)
Received lab result via fax. Will send document to scan center.   Collected: 11/14/2018 TB Gold negative

## 2019-03-13 ENCOUNTER — Other Ambulatory Visit: Payer: Self-pay

## 2019-03-13 DIAGNOSIS — Z20822 Contact with and (suspected) exposure to covid-19: Secondary | ICD-10-CM

## 2019-03-15 LAB — NOVEL CORONAVIRUS, NAA: SARS-CoV-2, NAA: NOT DETECTED

## 2019-05-07 NOTE — Progress Notes (Signed)
Office Visit Note  Patient: Erika King             Date of Birth: 06-23-1981           MRN: [id number]             PCP: Kathyrn Lass, MD Referring: Kathyrn Lass, MD Visit Date: 05/15/2019 Occupation: _0 @  Subjective:  Medication monitoring    History of Present Illness: Erika King is a 38 y.o. female with history of seropositive rheumatoid arthritis.  Patient is on Enbrel 50 mg subcutaneous injections every 2 to 3 weeks.  She has not had any increased joint pain or joint swelling since spacing the dose of Enbrel.  She denies any recent rheumatoid arthritis flares.  She denies any joint pain or joint swelling at this time.  She states that her knee joint pain has resolved. According to the patient she works for [employer] and has been performing virtual visits.  She states that there will be no longer offering virtual visit and she will be having to go back into patients homes.  She is not comfortable going to patient's arms during the COVID-19 pandemic while being on Enbrel.  She will be filing for FMLA.    Activities of Daily Living:  Patient reports morning stiffness for 0 minutes.   Patient Denies nocturnal pain.  Difficulty dressing/grooming: Denies Difficulty climbing stairs: Denies Difficulty getting out of chair: Denies Difficulty using hands for taps, buttons, cutlery, and/or writing: Denies  Review of Systems  Constitutional: Negative for fatigue.  HENT: Negative for mouth sores, mouth dryness and nose dryness.   Eyes: Negative for pain, visual disturbance and dryness.  Respiratory: Negative for cough, hemoptysis, shortness of breath and difficulty breathing.   Cardiovascular: Negative for chest pain, palpitations, hypertension and swelling in legs/feet.  Gastrointestinal: Positive for constipation. Negative for blood in stool and diarrhea.  Endocrine: Negative for increased urination.  Genitourinary: Negative for painful urination.   Musculoskeletal: Negative for arthralgias, joint pain, joint swelling, myalgias, muscle weakness, morning stiffness, muscle tenderness and myalgias.  Skin: Negative for color change, pallor, rash, hair loss, nodules/bumps, skin tightness, ulcers and sensitivity to sunlight.  Allergic/Immunologic: Negative for susceptible to infections.  Neurological: Negative for dizziness, numbness, headaches and weakness.  Hematological: Negative for swollen glands.  Psychiatric/Behavioral: Negative for depressed mood and sleep disturbance. The patient is not nervous/anxious.     PMFS History:  Patient Active Problem List   Diagnosis Date Noted  . High risk medication use 05/30/2018  . Rheumatoid arthritis involving multiple sites with positive rheumatoid factor ([place]) 12/14/2016  . Elevated LFTs 12/14/2016  . Left carpal tunnel syndrome 12/14/2016  . History of asthma 12/14/2016  . History of hyperthyroidism 12/14/2016  . Vitamin D deficiency 12/14/2016  . Fibroid, uterine 07/17/2016    Past Medical History:  Diagnosis Date  . Asthma   . Broken arm   . Broken foot   . Environmental and seasonal allergies   . History of radioactive iodine thyroid ablation 09/2015  . Hypothyroidism   . Indigestion   . RA (rheumatoid arthritis) ([place])   . Sinusitis   . Vitamin D deficiency     Family History  Problem Relation Age of Onset  . Pneumonia Mother   . Huntington's disease Mother   . Cancer Father        lung  . Hypertension Sister   . Depression Sister   . Huntington's disease Brother   . Healthy Daughter  Past Surgical History:  Procedure Laterality Date  . MYOMECTOMY N/A 07/17/2016   Procedure: MYOMECTOMY;  Surgeon: Everett Graff, MD;  Location: [facility];  Service: Gynecology;  Laterality: N/A;  2.5 hours  . ROOT CANAL    . TOE SURGERY    . UMBILICAL HERNIA REPAIR N/A 07/17/2016   Procedure: HERNIA REPAIR UMBILICAL ADULT;  Surgeon: Fanny Skates, MD;  Location: [facility];  Service:  General;  Laterality: N/A;  Insertion of mesh   Social History   Social History Narrative  . Not on file   Immunization History  Administered Date(s) Administered  . Influenza-Unspecified 04/30/2015  . Tetanus 11/28/2015     Objective: Vital Signs: BP 123/80 (BP Location: Left Arm, Patient Position: Sitting, Cuff Size: Normal)   Pulse 67   Resp 12   Ht 5' 5.5" (1.664 m)   Wt 191 lb 9.6 oz (86.9 kg)   BMI 31.40 kg/m    Physical Exam Vitals signs and nursing note reviewed.  Constitutional:      Appearance: She is well-developed.  HENT:     Head: Normocephalic and atraumatic.  Eyes:     Conjunctiva/sclera: Conjunctivae normal.  Neck:     Musculoskeletal: Normal range of motion.  Cardiovascular:     Rate and Rhythm: Normal rate and regular rhythm.     Heart sounds: Normal heart sounds.  Pulmonary:     Effort: Pulmonary effort is normal.     Breath sounds: Normal breath sounds.  Abdominal:     General: Bowel sounds are normal.     Palpations: Abdomen is soft.  Lymphadenopathy:     Cervical: No cervical adenopathy.  Skin:    General: Skin is warm and dry.     Capillary Refill: Capillary refill takes less than 2 seconds.  Neurological:     Mental Status: She is alert and oriented to person, place, and time.  Psychiatric:        Behavior: Behavior normal.      Musculoskeletal Exam: C-spine, thoracic spine, lumbar spine good range of motion.  No midline spinal tenderness.  No SI joint tenderness.  Shoulder joints, brisk wrist joints, MCPs, PIPs, DIPs good range of motion no synovitis.  Hip joints, knee joints, ankle joints, MTPs, PIPs, and DIPs good range of motion no synovitis.  No warmth or effusion of bilateral knee joints.  No tenderness or swelling of ankle joints.  CDAI Exam: CDAI Score: 0.4  Patient Global: 2 mm; Provider Global: 2 mm Swollen: 0 ; Tender: 0  Joint Exam   No joint exam has been documented for this visit   There is currently no information  documented on the homunculus. Go to the Rheumatology activity and complete the homunculus joint exam.  Investigation: No additional findings.  Imaging: No results found.  Recent Labs: Lab Results  Component Value Date   WBC 4.2 06/02/2018   HGB 12.1 06/02/2018   PLT 288 06/02/2018   NA 138 06/02/2018   K 4.2 06/02/2018   CL 105 06/02/2018   CO2 28 06/02/2018   GLUCOSE 82 06/02/2018   BUN 10 06/02/2018   CREATININE 0.88 06/02/2018   BILITOT 0.4 06/02/2018   ALKPHOS 30 (L) 07/18/2016   AST 19 06/02/2018   ALT 15 06/02/2018   PROT 7.2 06/02/2018   ALBUMIN 3.1 (L) 07/18/2016   CALCIUM 9.8 06/02/2018   GFRAA 97 06/02/2018   QFTBGOLDPLUS NEGATIVE 11/13/2017    Speciality Comments: No specialty comments available.  Procedures:  No procedures performed Allergies:  Black walnut pollen allergy skin test, Molds & smuts, Other, and Peanut oil   Assessment / Plan:     Visit Diagnoses: Rheumatoid arthritis involving multiple sites with positive rheumatoid factor (HCC) - +CCP, +RF: She has no active synovitis on exam.  She has not had any recent rheumatoid arthritis flares.  She is clinically doing well on Enbrel 50 mg subcutaneous injections every 2 to 3 weeks.  She has not had any increase joint pain or joint swelling since spacing the dose of Enbrel.  She has no joint pain or joint swelling at this time.  She has no morning stiffness or nocturnal pain.  She will continue injecting Enbrel every 2 to 3 weeks.  She does not need any refills at this time.  We will update CBC and CMP today.  She will return for lab work in January and every 3 months.  She was advised to notify us if she develops increased joint pain or joint swelling.  She will follow-up in the office in 5 months.  According to the patient her work is no longer offering virtual visits and she is apprehensive to go back into peoples homes during the COVID-19 pandemic.  She will be filing for FMLA.    High risk medication use  - Enbrel Sureclick 50 mg every 7 days.  Last TB gold negative on 11/14/2018.  Most recent CBC/CMP within normal limits except for low alk phose on 06/02/2018.  CBC and CMP will be drawn today. Inadequate response to MTX in the past.  We discussed the importance of holding Enbrel if she develops any signs or symptoms of an infection and to resume once infection has completely cleared.- Plan: CBC with Differential/Platelet, COMPLETE METABOLIC PANEL WITH GFR  Elevated LFTs: CMP ordered today.   Vitamin D deficiency: She is taking a vitamin D supplement.   Left carpal tunnel syndrome: Resolved.   Other medical conditions are listed as follows:   History of hyperthyroidism  History of asthma  Orders: Orders Placed This Encounter  Procedures  . CBC with Differential/Platelet  . COMPLETE METABOLIC PANEL WITH GFR   No orders of the defined types were placed in this encounter.     Follow-Up Instructions: Return in about 5 months (around 10/13/2019) for Rheumatoid arthritis.   Ofilia Neas, PA-C   I examined and evaluated the patient with Hazel Sams PA.  Patient had no synovitis on examination today.  She has been taking Enbrel every 3 weeks.  The plan of care was discussed as noted above.  Bo Merino, MD  Note - This record has been created using Editor, commissioning.  Chart creation errors have been sought, but may not always  have been located. Such creation errors do not reflect on  the standard of medical care.

## 2019-05-15 ENCOUNTER — Encounter: Payer: Self-pay | Admitting: Rheumatology

## 2019-05-15 ENCOUNTER — Ambulatory Visit (INDEPENDENT_AMBULATORY_CARE_PROVIDER_SITE_OTHER): Payer: 59 | Admitting: Rheumatology

## 2019-05-15 ENCOUNTER — Telehealth: Payer: Self-pay | Admitting: Rheumatology

## 2019-05-15 ENCOUNTER — Other Ambulatory Visit: Payer: Self-pay

## 2019-05-15 VITALS — BP 123/80 | HR 67 | Resp 12 | Ht 65.5 in | Wt 191.6 lb

## 2019-05-15 DIAGNOSIS — R7989 Other specified abnormal findings of blood chemistry: Secondary | ICD-10-CM | POA: Diagnosis not present

## 2019-05-15 DIAGNOSIS — E559 Vitamin D deficiency, unspecified: Secondary | ICD-10-CM | POA: Diagnosis not present

## 2019-05-15 DIAGNOSIS — M0579 Rheumatoid arthritis with rheumatoid factor of multiple sites without organ or systems involvement: Secondary | ICD-10-CM

## 2019-05-15 DIAGNOSIS — Z8639 Personal history of other endocrine, nutritional and metabolic disease: Secondary | ICD-10-CM

## 2019-05-15 DIAGNOSIS — G5602 Carpal tunnel syndrome, left upper limb: Secondary | ICD-10-CM

## 2019-05-15 DIAGNOSIS — Z79899 Other long term (current) drug therapy: Secondary | ICD-10-CM

## 2019-05-15 DIAGNOSIS — Z8709 Personal history of other diseases of the respiratory system: Secondary | ICD-10-CM

## 2019-05-15 NOTE — Patient Instructions (Signed)
Standing Labs We placed an order today for your standing lab work.    Please come back and get your standing labs in January and every 3 months   We have open lab daily Monday through Thursday from 8:30-12:30 PM and 1:30-4:30 PM and Friday from 8:30-12:30 PM and 1:30-4:00 PM at the office of Dr. Shaili Deveshwar.   You may experience shorter wait times on Monday and Friday afternoons. The office is located at [address], [address], [place], [place] [address] No appointment is necessary.   Labs are drawn by [facility].  You may receive a bill from [facility] for your lab work.  If you wish to have your labs drawn at another location, please call the office 24 hours in advance to send orders.  If you have any questions regarding directions or hours of operation,  please call [phone].   Just as a reminder please drink plenty of water prior to coming for your lab work. Thanks!   

## 2019-05-15 NOTE — Telephone Encounter (Signed)
Patient dropped off disability form, filled out authorization, and paid $25.00 in cash. All was sent to [facility] on 05/15/2019.

## 2019-05-16 LAB — COMPLETE METABOLIC PANEL WITH GFR
AG Ratio: 1.4 (calc) (ref 1.0–2.5)
ALT: 9 U/L (ref 6–29)
AST: 14 U/L (ref 10–30)
Albumin: 3.8 g/dL (ref 3.6–5.1)
Alkaline phosphatase (APISO): 30 U/L — ABNORMAL LOW (ref 31–125)
BUN: 8 mg/dL (ref 7–25)
CO2: 23 mmol/L (ref 20–32)
Calcium: 9.1 mg/dL (ref 8.6–10.2)
Chloride: 106 mmol/L (ref 98–110)
Creat: 0.86 mg/dL (ref 0.50–1.10)
GFR, Est African American: 99 mL/min/{1.73_m2} (ref >=60)
GFR, Est Non African American: 86 mL/min/{1.73_m2} (ref >=60)
Globulin: 2.7 g/dL (calc) (ref 1.9–3.7)
Glucose, Bld: 83 mg/dL (ref 65–99)
Potassium: 4.1 mmol/L (ref 3.5–5.3)
Sodium: 138 mmol/L (ref 135–146)
Total Bilirubin: 0.4 mg/dL (ref 0.2–1.2)
Total Protein: 6.5 g/dL (ref 6.1–8.1)

## 2019-05-16 LAB — CBC WITH DIFFERENTIAL/PLATELET
Absolute Monocytes: 271 cells/uL (ref 200–950)
Basophils Absolute: 30 cells/uL (ref 0–200)
Basophils Relative: 0.7 %
Eosinophils Absolute: 198 cells/uL (ref 15–500)
Eosinophils Relative: 4.6 %
HCT: 35.4 % (ref 35.0–45.0)
Hemoglobin: 11.6 g/dL — ABNORMAL LOW (ref 11.7–15.5)
Lymphs Abs: 1965 cells/uL (ref 850–3900)
MCH: 31.8 pg (ref 27.0–33.0)
MCHC: 32.8 g/dL (ref 32.0–36.0)
MCV: 97 fL (ref 80.0–100.0)
MPV: 10.3 fL (ref 7.5–12.5)
Monocytes Relative: 6.3 %
Neutro Abs: 1836 cells/uL (ref 1500–7800)
Neutrophils Relative %: 42.7 %
Platelets: 261 10*3/uL (ref 140–400)
RBC: 3.65 10*6/uL — ABNORMAL LOW (ref 3.80–5.10)
RDW: 11.8 % (ref 11.0–15.0)
Total Lymphocyte: 45.7 %
WBC: 4.3 10*3/uL (ref 3.8–10.8)

## 2019-05-18 NOTE — Progress Notes (Signed)
Hgb is borderline low-11.6.  RBC count mildly elevated.  Rest of CBC WNL.  Alk phos borderline low.  Rest of CMP WNL.  We will continue to monitor.

## 2019-06-02 ENCOUNTER — Ambulatory Visit: Payer: 59 | Admitting: Rheumatology

## 2019-06-23 ENCOUNTER — Ambulatory Visit (INDEPENDENT_AMBULATORY_CARE_PROVIDER_SITE_OTHER): Payer: 59 | Admitting: Cardiovascular Disease

## 2019-06-23 ENCOUNTER — Encounter: Payer: Self-pay | Admitting: Cardiovascular Disease

## 2019-06-23 ENCOUNTER — Other Ambulatory Visit: Payer: Self-pay

## 2019-06-23 VITALS — BP 122/76 | HR 72 | Ht 65.5 in | Wt 190.0 lb

## 2019-06-23 DIAGNOSIS — R0789 Other chest pain: Secondary | ICD-10-CM | POA: Diagnosis not present

## 2019-06-23 DIAGNOSIS — R079 Chest pain, unspecified: Secondary | ICD-10-CM | POA: Diagnosis not present

## 2019-06-23 DIAGNOSIS — Z8249 Family history of ischemic heart disease and other diseases of the circulatory system: Secondary | ICD-10-CM | POA: Diagnosis not present

## 2019-06-23 DIAGNOSIS — Z01812 Encounter for preprocedural laboratory examination: Secondary | ICD-10-CM

## 2019-06-23 DIAGNOSIS — E7800 Pure hypercholesterolemia, unspecified: Secondary | ICD-10-CM | POA: Insufficient documentation

## 2019-06-23 HISTORY — DX: Other chest pain: R07.89

## 2019-06-23 HISTORY — DX: Pure hypercholesterolemia, unspecified: E78.00

## 2019-06-23 MED ORDER — METOPROLOL TARTRATE 100 MG PO TABS: ORAL_TABLET | ORAL | 0 refills | Status: DC

## 2019-06-23 NOTE — Progress Notes (Signed)
Cardiology Office Note   Date:  06/23/2019   ID:  Erika King, DOB 10-20-1980, MRN [id number]  PCP:  Erika Lass, MD  Cardiologist:   Erika Latch, MD   No chief complaint on file.    History of Present Illness: Erika King is a 38 y.o. female with RA and asthma who is being seen today for the evaluation of atypical chest pain and family history of heart disease at the request of Erika Rend, MD.  She first noticed chest discomfort in August.  Her chest discomfort was not associated with shortness of breath, nausea or diaphoreiss. It was not pleurtic and seemed to occur randomly.  The longest episode lasted 20-30 minutes.  She took aspirin and started to feel better.  Lately it seems to be occurring less frequently.  In the past she had chest discomfort and was told that it was related to GERD.  She started on famotidine and changed her diet, which helped.  However in August it started to occur more frequently.  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  She checked her labs at work and noticed that her LDL increased by 20 in the last year.  She also increased her exercise and her HDL increased by 20 as well. Overall her diet has been pretty good.  She exercises 1 or 2 days/week with HITT workouts and has no exertional chest pain or shortness of breath.  She also denies lower extremity edema, orthopnea, or PND.  She has not experienced any palpitations, lightheadedness, or dizziness.  Of note, both her parents died young of noncardiovascular causes.  Her paternal grandmother has extensive cardiovascular disease.   Past Medical History:  Diagnosis Date  . Asthma   . Atypical chest pain 06/23/2019  . Broken arm   . Broken foot   . Environmental and seasonal allergies   . History of radioactive iodine thyroid ablation 09/2015  .  Hypothyroidism   . Indigestion   . Pure hypercholesterolemia 06/23/2019  . RA (rheumatoid arthritis) ([place])   . Sinusitis   . Vitamin D deficiency     Past Surgical History:  Procedure Laterality Date  . MYOMECTOMY N/A 07/17/2016   Procedure: MYOMECTOMY;  Surgeon: Everett Graff, MD;  Location: [facility];  Service: Gynecology;  Laterality: N/A;  2.5 hours  . ROOT CANAL    . TOE SURGERY    . UMBILICAL HERNIA REPAIR N/A 07/17/2016   Procedure: HERNIA REPAIR UMBILICAL ADULT;  Surgeon: Fanny Skates, MD;  Location: [facility];  Service: General;  Laterality: N/A;  Insertion of mesh     Current Outpatient Medications  Medication Sig Dispense Refill  . albuterol (PROVENTIL HFA;VENTOLIN HFA) 108 (90 Base) MCG/ACT inhaler Inhale 1-2 puffs into the lungs every 6 (six) hours as needed for wheezing or shortness of breath.    . Calcium-Magnesium-Vitamin D (CALCIUM 500 PO) Take 1 tablet by mouth every morning.     . cetirizine (ZYRTEC) 10 MG tablet Take 5 mg by mouth as needed for allergies.    . clindamycin (CLEOCIN T) 1 % lotion as needed.    . etanercept (ENBREL SURECLICK) 50 MG/ML injection INJECT 50MG  SUBCUTANEOUSLY  EVERY WEEK 12 Syringe 0  . fluticasone (FLONASE) 50 MCG/ACT nasal spray Place 1 spray into both nostrils daily as needed for allergies or rhinitis.    Marland Kitchen  levothyroxine (SYNTHROID, LEVOTHROID) 137 MCG tablet Take 137 mcg by mouth daily before breakfast.    . Magnesium 250 MG TABS Take by mouth every other day.    . montelukast (SINGULAIR) 10 MG tablet take 1 tablet by mouth daily as needed for allergies  2  . Multiple Vitamin (MULTIVITAMIN) capsule Take 1 capsule by mouth daily.    . Nutritional Supplements (CHRONO-IMMUNE SHIELD PO) Take 1 tablet by mouth as needed (when feeling under the weather).     . rosuvastatin (CRESTOR) 5 MG tablet TK 1 T PO QD    . Vitamin D, Ergocalciferol, (DRISDOL) 1.25 MG (50000 UT) CAPS capsule TAKE ONE CAPSULE BY MOUTH EVERY 30 DAYS 3 capsule 1  .  metoprolol tartrate (LOPRESSOR) 100 MG tablet TAKE 1 TABLET 2 HOURS PRIOR TO C 1 tablet 0   No current facility-administered medications for this visit.     Allergies:   Black walnut pollen allergy skin test, Molds & smuts, Other, and Peanut oil    Social History:  The patient  reports that she has never smoked. She has never used smokeless tobacco. She reports current alcohol use. She reports that she does not use drugs.   Family History:  The patient's family history includes Asthma in her mother; Cancer in her father; Cirrhosis in her father; Depression in her sister; Healthy in her daughter; Huntington's disease in her brother, maternal grandmother, and mother; Hypertension in her sister; Pneumonia in her mother; Thyroid disease in her mother.    ROS:  Please see the history of present illness.   Otherwise, review of systems are positive for none.   All other systems are reviewed and negative.    PHYSICAL EXAM: VS:  BP 122/76   Pulse 72   Ht 5' 5.5" (1.664 m)   Wt 190 lb (86.2 kg)   SpO2 99%   BMI 31.14 kg/m  , BMI Body mass index is 31.14 kg/m. GENERAL:  Well appearing HEENT:  Pupils equal round and reactive, fundi not visualized, oral mucosa unremarkable NECK:  No jugular venous distention, waveform within normal limits, carotid upstroke brisk and symmetric, no bruits, no thyromegaly LYMPHATICS:  No cervical adenopathy LUNGS:  Clear to auscultation bilaterally HEART:  RRR.  PMI not displaced or sustained,S1 and S2 within normal limits, no S3, no S4, no clicks, no rubs, mp  murmurs ABD:  Flat, positive bowel sounds normal in frequency in pitch, no bruits, no rebound, no guarding, no midline pulsatile mass, no hepatomegaly, no splenomegaly EXT:  2 plus pulses throughout, no edema, no cyanosis no clubbing SKIN:  No rashes no nodules NEURO:  Cranial nerves II through XII grossly intact, motor grossly intact throughout PSYCH:  Cognitively intact, oriented to person place and time     EKG:  EKG is ordered today. The ekg ordered today demonstrates sinus rhythm.  Rate 73 bpm.   Recent Labs: 05/15/2019: ALT 9; BUN 8; Creat 0.86; Hemoglobin 11.6; Platelets 261; Potassium 4.1; Sodium 138   12/16/2017: Total cholesterol 218, triglycerides 44, HDL 66, LDL 143 LDL 143 Total cholesterol 218  04/27/19: Tc 264, triglycerides 36, HDL 69, LDL 184   Lipid Panel No results found for: CHOL, TRIG, HDL, CHOLHDL, VLDL, LDLCALC, LDLDIRECT    Wt Readings from Last 3 Encounters:  06/23/19 190 lb (86.2 kg)  05/15/19 191 lb 9.6 oz (86.9 kg)  12/31/18 193 lb (87.5 kg)      ASSESSMENT AND PLAN:  # Chest pain: # Hyperlipidemia: Symptoms are very atypical.  I suspect that it is more related to stress and anxiety related to work and COVID-19.  However her lipids are quite elevated.  She does not technically meet the cutoff of an LDL of 194 familial hyperlipidemia.  However I suspect that there is a genetic component given that she exercises and has a healthy diet.  We will get a coronary CT-a to better assess her coronaries.  This will also help guide her LDL goal.    Current medicines are reviewed at length with the patient today.  The patient does not have concerns regarding medicines.  The following changes have been made:  no change  Labs/ tests ordered today include:   Orders Placed This Encounter  Procedures  . CT CORONARY MORPH W/CTA COR W/SCORE W/CA W/CM &/OR WO/CM  . CT CORONARY FRACTIONAL FLOW RESERVE DATA PREP  . CT CORONARY FRACTIONAL FLOW RESERVE FLUID ANALYSIS  . Basic metabolic panel  . EKG 12-Lead     Disposition:   FU with Murial Beam C. Oval Linsey, MD, [facility] in 1 year     Signed, Jeidi Gilles C. Oval Linsey, MD, [facility]  06/23/2019 5:09 PM    [facility]

## 2019-06-23 NOTE — Patient Instructions (Addendum)
Medication Instructions:  TAKE METOPROLOL 100 MG 2 HOURS PRIOR TO YOUR CARDIAC CT  *If you need a refill on your cardiac medications before your next appointment, please call your pharmacy*  Lab Work: BMET 1 WEEK PRIOR TO CARDIAC CT  If you have labs (blood work) drawn today and your tests are completely normal, you will receive your results only by: Marland Kitchen MyChart Message (if you have MyChart) OR . A paper copy in the mail If you have any lab test that is abnormal or we need to change your treatment, we will call you to review the results.  Testing/Procedures: Your physician has requested that you have cardiac CT. Cardiac computed tomography (CT) is a painless test that uses an x-ray machine to take clear, detailed pictures of your heart. For further information please visit HugeFiesta.tn. Please follow instruction sheet as given. ONCE INSURANCE HAS APPROVED THE OFFICE WILL CALL YOU TO SCHEDULE. IF YOU HAVE NOT HEARD FROM [place] IN 2 WEEKS CALL [phone]  Follow-Up: At [facility], you and your health needs are our priority.  As part of our continuing mission to provide you with exceptional heart care, we have created designated Provider Care Teams.  These Care Teams include your primary Cardiologist (physician) and Advanced Practice Providers (APPs -  Physician Assistants and Nurse Practitioners) who all work together to provide you with the care you need, when you need it.  Your next appointment:   12 month(s)  The format for your next appointment:   Either In Person or Virtual  Provider:   You may see [facility] or one of the following Advanced Practice Providers on your designated Care Team:    Kerin Ransom, PA-C  [place], [place]  Coletta Memos, [place]   Other Instructions  Your cardiac CT will be scheduled at one of the below locations:   [facility] [address] [place], [place] [address] [phone]  [facility] [address] [place], [place] [address] [phone]  If scheduled at [facility], please arrive at the [facility] main entrance of [facility] 30-45 minutes prior to test start time. Proceed to the [facility] Radiology Department (first floor) to check-in and test prep.  If scheduled at [facility], please arrive 15 mins early for check-in and test prep.  Please follow these instructions carefully (unless otherwise directed):  On the Night Before the Test: . Be sure to Drink plenty of water. . Do not consume any caffeinated/decaffeinated beverages or chocolate 12 hours prior to your test. . Do not take any antihistamines 12 hours prior to your test. . If the patient has contrast allergy: ? Patient will need a prescription for Prednisone and very clear instructions (as follows): 1. Prednisone 50 mg - take 13 hours prior to test 2. Take another Prednisone 50 mg 7 hours prior to test 3. Take another Prednisone 50 mg 1 hour prior to test 4. Take Benadryl 50 mg 1 hour prior to test . Patient must complete all four doses of above prophylactic medications. . Patient will need a ride after test due to Benadryl.  On the Day of the Test: . Drink plenty of water. Do not drink any water within one hour of the test. . Do not eat any food 4 hours prior to the test. . You may take your regular medications prior to the test.  . Take metoprolol (Lopressor) two hours prior to test. .  HOLD Furosemide/Hydrochlorothiazide morning of the test. . FEMALES- please wear underwire-free bra if available   Do not give Lopressor to patients with an allergy to lopressor or anyone with asthma or active COPD symptoms (currently taking steroids).  After the Test: . Drink plenty of water. . After receiving IV contrast, you may experience a mild flushed feeling. This is normal. . On occasion, you may experience a mild rash up to 24 hours after  the test. This is not dangerous. If this occurs, you can take Benadryl 25 mg and increase your fluid intake. . If you experience trouble breathing, this can be serious. If it is severe call 911 IMMEDIATELY. If it is mild, please call our office. . If you take any of these medications: Glipizide/Metformin, Avandament, Glucavance, please do not take 48 hours after completing test unless otherwise instructed.   Once we have confirmed authorization from your insurance company, we will call you to set up a date and time for your test.   For non-scheduling related questions, please contact the cardiac imaging nurse navigator should you have any questions/concerns: Marchia Bond, RN Navigator Cardiac Imaging [facility] [phone] Office

## 2019-08-05 ENCOUNTER — Telehealth: Payer: Self-pay | Admitting: *Deleted

## 2019-08-05 NOTE — Telephone Encounter (Signed)
-----   Message from Howie Ill sent at 08/05/2019 11:13 AM EST ----- Good morning ladies   I just called Ms Miedema just to verify that she did not want to have test done and she stated "she does not want to have test done at this time, she is changing insurance and she doesn't feel she needs to have this test." She will call back when she is ready to proceed or if she starts having a problem.  Thank you .  Romie Minus  ----- Message ----- From: Benancio Deeds Sent: 08/05/2019   9:57 AM EST To: Earvin Hansen, LPN, Howie Ill  She had an auth on file that is now expired but a note was in that she refused service?  ----- Message ----- From: Earvin Hansen, LPN Sent: [id number]  [id number] PM EST To: Earvin Hansen, LPN, Howie Ill, #  Hello all just following up on cardiac ct Thanks  Shona Pardo

## 2019-09-18 ENCOUNTER — Telehealth: Payer: Self-pay | Admitting: Pharmacy Technician

## 2019-09-18 NOTE — Telephone Encounter (Signed)
Received notification from [facility] regarding a prior authorization for ENBREL. Authorization has been APPROVED from 09/18/19 to 09/17/20.   (KeyThornton [id number]

## 2019-09-18 NOTE — Telephone Encounter (Signed)
Patient has new [employer].  Submitted a Prior Authorization request to [facility] for ENBREL via Cover My Meds. Will update once we receive a response.  PA# [id number]

## 2019-10-09 NOTE — Progress Notes (Signed)
Office Visit Note  Patient: Erika King             Date of Birth: [birth year]           MRN: [id number]             PCP: Kathyrn Lass, MD Referring: Kathyrn Lass, MD Visit Date: 10/16/2019 Occupation: @GUAROCC @  Subjective:  Medication monitoring   History of Present Illness: Erika King is a 39 y.o. female with history of seropositive rheumatoid arthritis.  Patient reports that her last dose of Enbrel was in December 2020 due to the concern for the COVID-19 pandemic.  She states that she has received both vaccinations and is ready to restart on Enbrel.  She denies any recent rheumatoid arthritis flares.  She denies any joint pain or joint swelling at this time.  She has been exercising on a regular basis and has been trying to lose weight.  She does not have any concerns today.   Activities of Daily Living:  Patient reports morning stiffness for 5-10 minutes.   Patient Denies nocturnal pain.  Difficulty dressing/grooming: Denies Difficulty climbing stairs: Denies Difficulty getting out of chair: Denies Difficulty using hands for taps, buttons, cutlery, and/or writing: Denies  Review of Systems  Constitutional: Negative for fatigue.  HENT: Negative for mouth sores, mouth dryness and nose dryness.   Eyes: Negative for itching and dryness.  Respiratory: Negative for shortness of breath, wheezing and difficulty breathing.   Cardiovascular: Negative for chest pain and palpitations.  Gastrointestinal: Negative for blood in stool, constipation and diarrhea.  Endocrine: Negative for increased urination.  Genitourinary: Negative for difficulty urinating and painful urination.  Musculoskeletal: Positive for arthralgias, joint pain and morning stiffness. Negative for joint swelling.  Skin: Negative for rash and hair loss.  Allergic/Immunologic: Negative for susceptible to infections.  Neurological: Positive for headaches. Negative for dizziness, numbness, memory loss and  weakness.  Hematological: Negative for bruising/bleeding tendency.  Psychiatric/Behavioral: Negative for confusion.    PMFS History:  Patient Active Problem List   Diagnosis Date Noted  . Atypical chest pain 06/23/2019  . Pure hypercholesterolemia 06/23/2019  . High risk medication use 05/30/2018  . Rheumatoid arthritis involving multiple sites with positive rheumatoid factor ([place]) 12/14/2016  . Elevated LFTs 12/14/2016  . Left carpal tunnel syndrome 12/14/2016  . History of asthma 12/14/2016  . History of hyperthyroidism 12/14/2016  . Vitamin D deficiency 12/14/2016  . Fibroid, uterine 07/17/2016    Past Medical History:  Diagnosis Date  . Asthma   . Atypical chest pain 06/23/2019  . Broken arm   . Broken foot   . Environmental and seasonal allergies   . History of radioactive iodine thyroid ablation 09/2015  . Hypothyroidism   . Indigestion   . Pure hypercholesterolemia 06/23/2019  . RA (rheumatoid arthritis) ([place])   . Sinusitis   . Vitamin D deficiency     Family History  Problem Relation Age of Onset  . Pneumonia Mother   . Huntington's disease Mother   . Thyroid disease Mother   . Asthma Mother   . Cancer Father        lung  . Cirrhosis Father   . Hypertension Sister   . Depression Sister   . Huntington's disease Brother   . Healthy Daughter   . Huntington's disease Maternal Grandmother    Past Surgical History:  Procedure Laterality Date  . MYOMECTOMY N/A 07/17/2016   Procedure: MYOMECTOMY;  Surgeon: Everett Graff, MD;  Location: [facility];  Service: Gynecology;  Laterality: N/A;  2.5 hours  . ROOT CANAL    . TOE SURGERY    . UMBILICAL HERNIA REPAIR N/A 07/17/2016   Procedure: HERNIA REPAIR UMBILICAL ADULT;  Surgeon: Fanny Skates, MD;  Location: [facility];  Service: General;  Laterality: N/A;  Insertion of mesh   Social History   Social History Narrative  . Not on file   Immunization History  Administered Date(s) Administered  .  Influenza-Unspecified 04/30/2015  . Tetanus 11/28/2015     Objective: Vital Signs: BP 122/75 (BP Location: Left Arm, Patient Position: Sitting, Cuff Size: Normal)   Pulse 84   Resp 13   Ht 5' 5.5" (1.664 m)   Wt 191 lb 9.6 oz (86.9 kg)   BMI 31.40 kg/m    Physical Exam Vitals and nursing note reviewed.  Constitutional:      Appearance: She is well-developed.  HENT:     Head: Normocephalic and atraumatic.  Eyes:     Conjunctiva/sclera: Conjunctivae normal.  Pulmonary:     Effort: Pulmonary effort is normal.  Abdominal:     General: Bowel sounds are normal.     Palpations: Abdomen is soft.  Musculoskeletal:     Cervical back: Normal range of motion.  Lymphadenopathy:     Cervical: No cervical adenopathy.  Skin:    General: Skin is warm and dry.     Capillary Refill: Capillary refill takes less than 2 seconds.  Neurological:     Mental Status: She is alert and oriented to person, place, and time.  Psychiatric:        Behavior: Behavior normal.      Musculoskeletal Exam: C-spine, thoracic spine, and lumbar spine good ROM.  No midline spinal tenderness.  Shoulder joints, elbow joints, wrist joints, MCPs, PIPs, and DIPs good ROM with no synovitis.  Complete fist formation bilaterally.  Hip joints, knee joints and ankle joints MTPs, PIPs, DIPs good range of motion with no synovitis.  No warmth or effusion of bilateral knee joints.  No tenderness or swelling of ankle joints.  No tenderness over trochanteric bursa bilaterally  CDAI Exam: CDAI Score: 0.2  Patient Global: 1 mm; Provider Global: 1 mm Swollen: 0 ; Tender: 0  Joint Exam 10/16/2019   No joint exam has been documented for this visit   There is currently no information documented on the homunculus. Go to the Rheumatology activity and complete the homunculus joint exam.  Investigation: No additional findings.  Imaging: No results found.  Recent Labs: Lab Results  Component Value Date   WBC 4.3 05/15/2019    HGB 11.6 (L) 05/15/2019   PLT 261 05/15/2019   NA 138 05/15/2019   K 4.1 05/15/2019   CL 106 05/15/2019   CO2 23 05/15/2019   GLUCOSE 83 05/15/2019   BUN 8 05/15/2019   CREATININE 0.86 05/15/2019   BILITOT 0.4 05/15/2019   ALKPHOS 30 (L) 07/18/2016   AST 14 05/15/2019   ALT 9 05/15/2019   PROT 6.5 05/15/2019   ALBUMIN 3.1 (L) 07/18/2016   CALCIUM 9.1 05/15/2019   GFRAA 99 05/15/2019   QFTBGOLDPLUS NEGATIVE 11/13/2017    Speciality Comments: No specialty comments available.  Procedures:  No procedures performed Allergies: Black walnut pollen allergy skin test, Molds & smuts, Other, and Peanut oil   Assessment / Plan:     Visit Diagnoses: Rheumatoid arthritis involving multiple sites with positive rheumatoid factor (HCC) -  +CCP, +RF: She has no synovitis on exam today.  She is  not had any recent rheumatoid arthritis flares.  Her last dose of Enbrel was in December 2020 due to her concern for COVID-19.  She has received both COVID-19 vaccinations and is planning on resuming Enbrel injections when she updates her lab work today.  We discussed the importance of staying compliant with Enbrel injections.  She was previously spacing Enbrel to every 5 to 6 weeks so we discussed returning to this regimen.  She is not having any joint pain or joint swelling at this time.  She does experience morning stiffness lasting 5 to 10 minutes daily.  She has been exercising on a regular basis and has been trying to lose weight.  She was advised to notify us if she develops increased joint pain or joint swelling.  She will follow-up in the office in 5 months.  High risk medication use - Enbrel Sureclick 50 mg every 5-6 weeks.  Last TB gold negative on 11/14/2018.  TB gold will be checked today.  CBC and CMP were drawn on 05/15/2019.  We will repeat CBC and CMP today.  She is due for lab work in June and every 3 months to monitor for drug toxicity.  She has received both COVID-19 vaccinations.  She has not had  any recent infections.- Plan: CBC with Differential/Platelet, COMPLETE METABOLIC PANEL WITH GFR, QuantiFERON-TB Gold Plus  Elevated LFTs: LFTs were within normal limits on 05/15/2019.  CMP will be ordered today.  Vitamin D deficiency -vitamin D level was 31 on 06/02/2018.  Patient requested to have vitamin D level checked today.  She states in the past she has had to take 50,000 units once weekly due to being diagnosed with vitamin D deficiency by her gynecologist.  Plan: VITAMIN D 25 Hydroxy (Vit-D Deficiency, Fractures)  Left carpal tunnel syndrome: Resolved.  Other medical conditions are listed as follows:  History of hyperthyroidism  History of asthma  Orders: Orders Placed This Encounter  Procedures  . CBC with Differential/Platelet  . COMPLETE METABOLIC PANEL WITH GFR  . VITAMIN D 25 Hydroxy (Vit-D Deficiency, Fractures)  . QuantiFERON-TB Gold Plus   No orders of the defined types were placed in this encounter.    Follow-Up Instructions: Return in about 5 months (around 03/17/2020) for Rheumatoid arthritis.   Ofilia Neas, PA-C   I examined and evaluated the patient with Hazel Sams PA.  Patient has been off Enbrel for 3 months now.  She had no synovitis on examination.  Although she has been experiencing some stiffness.  She has been fully immunized for COVID-19 she will resume her Enbrel.  She plans to take it every 4 weeks.  The plan of care was discussed as noted above.  Bo Merino, MD  Note - This record has been created using Editor, commissioning.  Chart creation errors have been sought, but may not always  have been located. Such creation errors do not reflect on  the standard of medical care.

## 2019-10-16 ENCOUNTER — Other Ambulatory Visit: Payer: Self-pay

## 2019-10-16 ENCOUNTER — Ambulatory Visit (INDEPENDENT_AMBULATORY_CARE_PROVIDER_SITE_OTHER): Payer: BC Managed Care – PPO | Admitting: Rheumatology

## 2019-10-16 ENCOUNTER — Encounter: Payer: Self-pay | Admitting: Rheumatology

## 2019-10-16 VITALS — BP 122/75 | HR 84 | Resp 13 | Ht 65.5 in | Wt 191.6 lb

## 2019-10-16 DIAGNOSIS — Z8709 Personal history of other diseases of the respiratory system: Secondary | ICD-10-CM

## 2019-10-16 DIAGNOSIS — G5602 Carpal tunnel syndrome, left upper limb: Secondary | ICD-10-CM

## 2019-10-16 DIAGNOSIS — M0579 Rheumatoid arthritis with rheumatoid factor of multiple sites without organ or systems involvement: Secondary | ICD-10-CM

## 2019-10-16 DIAGNOSIS — E559 Vitamin D deficiency, unspecified: Secondary | ICD-10-CM | POA: Diagnosis not present

## 2019-10-16 DIAGNOSIS — Z8639 Personal history of other endocrine, nutritional and metabolic disease: Secondary | ICD-10-CM

## 2019-10-16 DIAGNOSIS — Z79899 Other long term (current) drug therapy: Secondary | ICD-10-CM

## 2019-10-16 DIAGNOSIS — R7989 Other specified abnormal findings of blood chemistry: Secondary | ICD-10-CM

## 2019-10-16 NOTE — Patient Instructions (Signed)
Standing Labs We placed an order today for your standing lab work.    Please come back and get your standing labs in 1 month   TB gold is due mid-April   We have open lab daily Monday through Thursday from 8:30-12:30 PM and 1:30-4:30 PM and Friday from 8:30-12:30 PM and 1:30-4:00 PM at the office of Dr. Bo Merino.   You may experience shorter wait times on Monday and Friday afternoons. The office is located at [address], [place], [place], [place] [address] No appointment is necessary.   Labs are drawn by [facility].  You may receive a bill from [facility] for your lab work.  If you wish to have your labs drawn at another location, please call the office 24 hours in advance to send orders.  If you have any questions regarding directions or hours of operation,  please call [phone].   Just as a reminder please drink plenty of water prior to coming for your lab work. Thanks!

## 2019-11-09 ENCOUNTER — Encounter: Payer: Self-pay | Admitting: *Deleted

## 2019-11-09 NOTE — Telephone Encounter (Signed)
This encounter was created in error - please disregard.

## 2019-12-31 ENCOUNTER — Telehealth: Payer: Self-pay | Admitting: *Deleted

## 2019-12-31 NOTE — Telephone Encounter (Signed)
Dr. Estanislado Pandy needs to discuss disability paperwork with patient before paperwork can be completed. Patient needs to make an appt to discuss disability paperwork. I called patient, patient will call office to schedule appt w/Dr. Estanislado Pandy when she is available. Paperwork in green folder at [employer].

## 2020-01-20 ENCOUNTER — Telehealth: Payer: Self-pay | Admitting: *Deleted

## 2020-01-20 NOTE — Telephone Encounter (Signed)
FMLA paperwork received completed from [facility] and requesting Dr.Deveshwar's signature. FMLA was not discussed with Dr. Estanislado Pandy by the patient prior to submitting it to [facility]. Patient was contacted on 12/31/2019 to advise her that she will need an appointment to discuss FMLA paperwork prior to Dr. Estanislado Pandy signing it. Patient declined to make an appointment. We held onto to paperwork in the event patient changed her mind about scheduling an appointment. Patient has not called the office to schedule appointment. Will shred paperwork. If patient makes an appointment to discuss paperwork will have to be updated.

## 2020-03-04 NOTE — Progress Notes (Signed)
Office Visit Note  Patient: Erika King             Date of Birth: 1981-05-29           MRN: [id number]             PCP: Kathyrn Lass, MD Referring: Kathyrn Lass, MD Visit Date: 03/18/2020 Occupation: @GUAROCC @  Subjective:  Medication Management   History of Present Illness: Erika King is a 39 y.o. female with history of rheumatoid arthritis.  She is a Designer, jewellery.  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  She has been taking Enbrel about once a month.  She denies any joint swelling or joint pain.  She has been doing well.  Activities of Daily Living:  Patient reports morning stiffness for a few minutes.   Patient Denies nocturnal pain.  Difficulty dressing/grooming: Denies Difficulty climbing stairs: Denies Difficulty getting out of chair: Denies Difficulty using hands for taps, buttons, cutlery, and/or writing: Denies  Review of Systems  Constitutional: Negative for fatigue.  HENT: Positive for nose dryness. Negative for mouth sores and mouth dryness.   Eyes: Negative for itching and dryness.  Respiratory: Negative for shortness of breath and difficulty breathing.   Cardiovascular: Negative for chest pain and palpitations.  Gastrointestinal: Negative for blood in stool, constipation and diarrhea.  Endocrine: Negative for increased urination.  Genitourinary: Negative for difficulty urinating.  Musculoskeletal: Positive for morning stiffness. Negative for arthralgias, joint pain, joint swelling, myalgias, muscle tenderness and myalgias.  Skin: Negative for color change, rash and redness.  Allergic/Immunologic: Negative for susceptible to infections.  Neurological: Positive for headaches. Negative for dizziness, numbness, memory loss and weakness.  Hematological: Negative for bruising/bleeding tendency.  Psychiatric/Behavioral: Negative for confusion.    PMFS  History:  Patient Active Problem List   Diagnosis Date Noted  . Atypical chest pain 06/23/2019  . Pure hypercholesterolemia 06/23/2019  . High risk medication use 05/30/2018  . Rheumatoid arthritis involving multiple sites with positive rheumatoid factor ([place]) 12/14/2016  . Elevated LFTs 12/14/2016  . Left carpal tunnel syndrome 12/14/2016  . History of asthma 12/14/2016  . History of hyperthyroidism 12/14/2016  . Vitamin D deficiency 12/14/2016  . Fibroid, uterine 07/17/2016    Past Medical History:  Diagnosis Date  . Asthma   . Atypical chest pain 06/23/2019  . Broken arm   . Broken foot   . Environmental and seasonal allergies   . History of radioactive iodine thyroid ablation 09/2015  . Hypothyroidism   . Indigestion   . Pure hypercholesterolemia 06/23/2019  . RA (rheumatoid arthritis) ([place])   . Sinusitis   . Vitamin D deficiency     Family History  Problem Relation Age of Onset  . Pneumonia Mother   . Huntington's disease Mother   . Thyroid disease Mother   . Asthma Mother   . Cancer Father        lung  . Cirrhosis Father   . Hypertension Sister   . Depression Sister   . Huntington's disease Brother   . Healthy Daughter   . Huntington's disease Maternal Grandmother    Past Surgical History:  Procedure Laterality Date  . MYOMECTOMY N/A 07/17/2016   Procedure: MYOMECTOMY;  Surgeon: Everett Graff, MD;  Location: [facility];  Service: Gynecology;  Laterality: N/A;  2.5 hours  . ROOT CANAL    . TOE SURGERY    . UMBILICAL HERNIA REPAIR N/A  07/17/2016   Procedure: HERNIA REPAIR UMBILICAL ADULT;  Surgeon: Fanny Skates, MD;  Location: [facility];  Service: General;  Laterality: N/A;  Insertion of mesh   Social History   Social History Narrative  . Not on file   Immunization History  Administered Date(s) Administered  . Influenza-Unspecified 04/30/2015  . PFIZER SARS-COV-2 Vaccination 08/14/2019, 09/04/2019  . Tetanus 11/28/2015     Objective: Vital Signs: BP  112/76 (BP Location: Left Arm, Patient Position: Sitting, Cuff Size: Normal)   Pulse 73   Resp 14   Ht 5\' 6"  (1.676 m)   Wt 195 lb 3.2 oz (88.5 kg)   BMI 31.51 kg/m    Physical Exam Vitals and nursing note reviewed.  Constitutional:      Appearance: She is well-developed.  HENT:     Head: Normocephalic and atraumatic.  Eyes:     Conjunctiva/sclera: Conjunctivae normal.  Cardiovascular:     Rate and Rhythm: Normal rate and regular rhythm.     Heart sounds: Normal heart sounds.  Pulmonary:     Effort: Pulmonary effort is normal.     Breath sounds: Normal breath sounds.  Abdominal:     General: Bowel sounds are normal.     Palpations: Abdomen is soft.  Musculoskeletal:     Cervical back: Normal range of motion.  Lymphadenopathy:     Cervical: No cervical adenopathy.  Skin:    General: Skin is warm and dry.     Capillary Refill: Capillary refill takes less than 2 seconds.  Neurological:     Mental Status: She is alert and oriented to person, place, and time.  Psychiatric:        Behavior: Behavior normal.      Musculoskeletal Exam: C-spine thoracic and lumbar spine were in good range of motion.  She shoulder joints, elbow joints, wrist joints, MCPs PIPs and DIPs with good range of motion with no synovitis.  Hip joints, knee joints, ankles, MTPs and PIPs with good range of motion with no synovitis.  CDAI Exam: CDAI Score: 0  Patient Global: 0 mm; Provider Global: 0 mm Swollen: 0 ; Tender: 0  Joint Exam 03/18/2020   No joint exam has been documented for this visit   There is currently no information documented on the homunculus. Go to the Rheumatology activity and complete the homunculus joint exam.  Investigation: No additional findings.  Imaging: No results found.  Recent Labs: Lab Results  Component Value Date   WBC 4.3 05/15/2019   HGB 11.6 (L) 05/15/2019   PLT 261 05/15/2019   NA 138 05/15/2019   K 4.1 05/15/2019   CL 106 05/15/2019   CO2 23 05/15/2019    GLUCOSE 83 05/15/2019   BUN 8 05/15/2019   CREATININE 0.86 05/15/2019   BILITOT 0.4 05/15/2019   ALKPHOS 30 (L) 07/18/2016   AST 14 05/15/2019   ALT 9 05/15/2019   PROT 6.5 05/15/2019   ALBUMIN 3.1 (L) 07/18/2016   CALCIUM 9.1 05/15/2019   GFRAA 99 05/15/2019   QFTBGOLDPLUS NEGATIVE 11/13/2017    Speciality Comments: No specialty comments available.  Procedures:  No procedures performed Allergies: Black walnut pollen allergy skin test, Molds & smuts, Other, and Peanut oil   Assessment / Plan:     Visit Diagnoses: Rheumatoid arthritis involving multiple sites with positive rheumatoid factor (HCC) -  +CCP, +RF: Patient states that she is doing very well with no joint pain or inflammation.  She has been taking Enbrel once a month.  High  risk medication use - Enbrel Sureclick 50 mg every 4 weeks.  - Plan: CBC with Differential/Platelet, COMPLETE METABOLIC PANEL WITH GFR, QuantiFERON-TB Gold Plus today.  We will check CBC and CMP every 3 months to monitor for drug toxicity.  She has not had labs in a long time.  Have advised her to get labs every 3 months.  Vitamin D deficiency-she takes vitamin D supplement.  History of hyperthyroidism  History of asthma  She is fully vaccinated against COVID-19.  She can get her booster now.  COVID-19 instructions were given and were placed in the AVS.  I advised her to contact her PCPs office or our office in case she develops COVID-19 infection.  She might be eligible to receive monoclonal antibody infusion.  Orders: Orders Placed This Encounter  Procedures  . CBC with Differential/Platelet  . COMPLETE METABOLIC PANEL WITH GFR  . QuantiFERON-TB Gold Plus   Meds ordered this encounter  Medications  . etanercept (ENBREL SURECLICK) 50 MG/ML injection    Sig: INJECT 50MG  SUBCUTANEOUSLY EVERY 28 DAYS.    Dispense:  3 mL    Refill:  0     Follow-Up Instructions: Return in about 5 months (around 08/18/2020) for Rheumatoid  arthritis.   Bo Merino, MD  Note - This record has been created using Editor, commissioning.  Chart creation errors have been sought, but may not always  have been located. Such creation errors do not reflect on  the standard of medical care.

## 2020-03-18 ENCOUNTER — Ambulatory Visit (INDEPENDENT_AMBULATORY_CARE_PROVIDER_SITE_OTHER): Payer: BC Managed Care – PPO | Admitting: Rheumatology

## 2020-03-18 ENCOUNTER — Encounter: Payer: Self-pay | Admitting: Rheumatology

## 2020-03-18 ENCOUNTER — Other Ambulatory Visit: Payer: Self-pay

## 2020-03-18 VITALS — BP 112/76 | HR 73 | Resp 14 | Ht 66.0 in | Wt 195.2 lb

## 2020-03-18 DIAGNOSIS — Z8639 Personal history of other endocrine, nutritional and metabolic disease: Secondary | ICD-10-CM | POA: Diagnosis not present

## 2020-03-18 DIAGNOSIS — Z79899 Other long term (current) drug therapy: Secondary | ICD-10-CM | POA: Diagnosis not present

## 2020-03-18 DIAGNOSIS — E559 Vitamin D deficiency, unspecified: Secondary | ICD-10-CM | POA: Diagnosis not present

## 2020-03-18 DIAGNOSIS — M0579 Rheumatoid arthritis with rheumatoid factor of multiple sites without organ or systems involvement: Secondary | ICD-10-CM | POA: Diagnosis not present

## 2020-03-18 DIAGNOSIS — Z8709 Personal history of other diseases of the respiratory system: Secondary | ICD-10-CM

## 2020-03-18 MED ORDER — ENBREL SURECLICK 50 MG/ML ~~LOC~~ SOAJ: SUBCUTANEOUS | 0 refills | Status: DC

## 2020-03-18 NOTE — Patient Instructions (Addendum)
COVID-19 vaccine recommendations:  ° °COVID-19 vaccine is recommended for everyone (unless you are allergic to a vaccine component), even if you are on a medication that suppresses your immune system.  ° °If you are on Methotrexate, Cellcept (mycophenolate), Rinvoq, Xeljanz, and Olumiant- hold the medication for 1 week after each vaccine. Hold Methotrexate for 2 weeks after the single dose COVID-19 vaccine.  ° °If you are on Orencia subcutaneous injection - hold medication one week prior to and one week after the first COVID-19 vaccine dose (only).  ° °If you are on Orencia IV infusions- time vaccination administration so that the first COVID-19 vaccination will occur four weeks after the infusion and postpone the subsequent infusion by one week.  ° °If you are on Cyclophosphamide or Rituxan infusions please contact your doctor prior to receiving the COVID-19 vaccine.  ° °Do not take Tylenol or any anti-inflammatory medications (NSAIDs) 24 hours prior to the COVID-19 vaccination.  ° °There is no direct evidence about the efficacy of the COVID-19 vaccine in individuals who are on medications that suppress the immune system.  ° °Even if you are fully vaccinated, and you are on any medications that suppress your immune system, please continue to wear a mask, maintain at least six feet social distance and practice hand hygiene.  ° °If you develop a COVID-19 infection, please contact your PCP or our office to determine if you need antibody infusion. ° °The booster vaccine is now available for immunocompromised patients. It is advised that if you had Pfizer vaccine you should get Pfizer booster.  If you had a Moderna vaccine then you should get a Moderna booster. Johnson and Johnson does not have a booster vaccine at this time. ° °Please see the following web sites for updated information.   ° °https://www.rheumatology.org/Portals/0/Files/COVID-19-Vaccination-Patient-Resources.pdf ° °https://www.rheumatology.org/About-Us/Newsroom/Press-Releases/ID/1159 ° °Standing Labs °We placed an order today for your standing lab work.  ° °Please have your standing labs drawn in November and every 3 months  ° °If possible, please have your labs drawn 2 weeks prior to your appointment so that the provider can discuss your results at your appointment. ° °We have open lab daily °Monday through Thursday from 8:30-12:30 PM and 1:30-4:30 PM and Friday from 8:30-12:30 PM and 1:30-4:00 PM °at the office of Dr. Linell Meldrum, [facility].   °Please be advised, patients with office appointments requiring lab work will take precedents over walk-in lab work.  °If possible, please come for your lab work on Monday and Friday afternoons, as you may experience shorter wait times. °The office is located at [address], [address], [place], [place] [address] °No appointment is necessary.   °Labs are drawn by [facility]. Please bring your co-pay at the time of your lab draw.  You may receive a bill from [facility] for your lab work. ° °If you wish to have your labs drawn at another location, please call the office 24 hours in advance to send orders. ° °If you have any questions regarding directions or hours of operation,  °please call [phone].   °As a reminder, please drink plenty of water prior to coming for your lab work. Thanks! ° ° °

## 2020-03-21 ENCOUNTER — Encounter: Payer: Self-pay | Admitting: *Deleted

## 2020-03-21 ENCOUNTER — Telehealth: Payer: Self-pay | Admitting: Rheumatology

## 2020-03-21 LAB — CBC WITH DIFFERENTIAL/PLATELET
Absolute Monocytes: 356 cells/uL (ref 200–950)
Basophils Absolute: 48 cells/uL (ref 0–200)
Basophils Relative: 1.1 %
Eosinophils Absolute: 277 cells/uL (ref 15–500)
Eosinophils Relative: 6.3 %
HCT: 36.4 % (ref 35.0–45.0)
Hemoglobin: 12 g/dL (ref 11.7–15.5)
Lymphs Abs: 1505 cells/uL (ref 850–3900)
MCH: 32.4 pg (ref 27.0–33.0)
MCHC: 33 g/dL (ref 32.0–36.0)
MCV: 98.4 fL (ref 80.0–100.0)
MPV: 10.4 fL (ref 7.5–12.5)
Monocytes Relative: 8.1 %
Neutro Abs: 2213 cells/uL (ref 1500–7800)
Neutrophils Relative %: 50.3 %
Platelets: 270 10*3/uL (ref 140–400)
RBC: 3.7 10*6/uL — ABNORMAL LOW (ref 3.80–5.10)
RDW: 11.9 % (ref 11.0–15.0)
Total Lymphocyte: 34.2 %
WBC: 4.4 10*3/uL (ref 3.8–10.8)

## 2020-03-21 LAB — COMPLETE METABOLIC PANEL WITH GFR
AG Ratio: 1.3 (calc) (ref 1.0–2.5)
ALT: 16 U/L (ref 6–29)
AST: 19 U/L (ref 10–30)
Albumin: 4.1 g/dL (ref 3.6–5.1)
Alkaline phosphatase (APISO): 36 U/L (ref 31–125)
BUN: 10 mg/dL (ref 7–25)
CO2: 24 mmol/L (ref 20–32)
Calcium: 9.2 mg/dL (ref 8.6–10.2)
Chloride: 106 mmol/L (ref 98–110)
Creat: 0.88 mg/dL (ref 0.50–1.10)
GFR, Est African American: 96 mL/min/{1.73_m2} (ref >=60)
GFR, Est Non African American: 83 mL/min/{1.73_m2} (ref >=60)
Globulin: 3.1 g/dL (calc) (ref 1.9–3.7)
Glucose, Bld: 75 mg/dL (ref 65–99)
Potassium: 4.3 mmol/L (ref 3.5–5.3)
Sodium: 137 mmol/L (ref 135–146)
Total Bilirubin: 0.6 mg/dL (ref 0.2–1.2)
Total Protein: 7.2 g/dL (ref 6.1–8.1)

## 2020-03-21 LAB — QUANTIFERON-TB GOLD PLUS
Mitogen-NIL: >10 IU/mL
NIL: 0.02 IU/mL
QuantiFERON-TB Gold Plus: NEGATIVE
TB1-NIL: 0 IU/mL
TB2-NIL: 0 IU/mL

## 2020-03-21 NOTE — Progress Notes (Signed)
CBC and CMP are stable.  TB gold is negative

## 2020-03-21 NOTE — Telephone Encounter (Signed)
Spoke with pharmacy and provided diagnosis code.

## 2020-03-21 NOTE — Telephone Encounter (Signed)
Lovena Le from [place] called stating a diagnosis code is needed for patient's Enbrel.  Please call back at [phone]

## 2020-03-31 ENCOUNTER — Telehealth: Payer: Self-pay | Admitting: *Deleted

## 2020-03-31 NOTE — Telephone Encounter (Signed)
Received paperwork for Concurrent Disability and leave. There was a note attached from patient requesting the form to be filled out for patient to return to work. Patient has not been taken out of work by Dr. Estanislado Pandy. We will be unable to fill out this paperwork for her to return to work. Patient will need to discuss this paperwork with the physician that took her out of work.  Attempted to contact the patient and left message for patient to call the office. Patient;s paperwork is in pending folder.

## 2020-04-05 ENCOUNTER — Telehealth: Payer: Self-pay | Admitting: Pharmacy Technician

## 2020-04-05 NOTE — Telephone Encounter (Signed)
Submitted a Prior Authorization request to [facility] for ENBREL via Fax. Will update once we receive a response.   PA# [id number] Phone# [phone]

## 2020-04-11 NOTE — Telephone Encounter (Signed)
Received PA request from [facility] via fax.  Filled out request form and faxed to [facility] along with supporting clinical documents.  Fax [phone]   Mariella Saa, PharmD, BCACP, CPP Clinical Specialty Pharmacist (Rheumatology and Pulmonology)  04/11/2020 4:20 PM

## 2020-04-13 ENCOUNTER — Telehealth: Payer: Self-pay | Admitting: Rheumatology

## 2020-04-13 NOTE — Telephone Encounter (Signed)
Spoke with pharmacist and clarified patient is taking Enbrel every 28 days. Advised okay to fill with 4 pens as they can not break the box and they have to dispense 4 pens.

## 2020-04-13 NOTE — Telephone Encounter (Signed)
Erika King from [place] called stating the Enbrel Sureclick that was sent on 03/18/20 was for a quantity of 3 to be taken every 28 days.  The prescription comes in a box with a quantity of 4 and are requesting the prescription be sent for a 4 month supply instead of a 3 month supply.

## 2020-04-15 ENCOUNTER — Telehealth: Payer: Self-pay | Admitting: Rheumatology

## 2020-04-15 NOTE — Telephone Encounter (Signed)
[facility] for patient, Erika King will call patient ASAP on 04/18/2020 to advise.

## 2020-04-15 NOTE — Telephone Encounter (Signed)
Patient called checking the status of her [place] paperwork.  Patient is requesting a return call.

## 2020-04-18 NOTE — Telephone Encounter (Signed)
Attempted to contact the patient and left message for patient to call the office regarding her paperwork. According to paperwork we only wrote her out until 08/15/2019. Will need to find out from patient who continued to have her out from 08/16/2019 to 03/13/2020.

## 2020-06-21 ENCOUNTER — Other Ambulatory Visit: Payer: Self-pay | Admitting: Obstetrics and Gynecology

## 2020-06-21 DIAGNOSIS — R928 Other abnormal and inconclusive findings on diagnostic imaging of breast: Secondary | ICD-10-CM

## 2020-07-07 ENCOUNTER — Ambulatory Visit
Admission: RE | Admit: 2020-07-07 | Discharge: 2020-07-07 | Disposition: A | Discharge status: Home / Self Care | Payer: BC Managed Care – PPO | Source: Ambulatory Visit | Attending: Obstetrics and Gynecology | Admitting: Obstetrics and Gynecology

## 2020-07-07 ENCOUNTER — Other Ambulatory Visit: Payer: Self-pay

## 2020-07-07 ENCOUNTER — Other Ambulatory Visit: Payer: Self-pay | Admitting: Obstetrics and Gynecology

## 2020-07-07 DIAGNOSIS — R928 Other abnormal and inconclusive findings on diagnostic imaging of breast: Secondary | ICD-10-CM

## 2020-07-07 LAB — HM MAMMOGRAPHY

## 2020-08-17 ENCOUNTER — Ambulatory Visit: Payer: BC Managed Care – PPO | Attending: Family

## 2020-08-17 DIAGNOSIS — Z23 Encounter for immunization: Secondary | ICD-10-CM

## 2020-12-12 NOTE — Progress Notes (Signed)
   Covid-19 Vaccination Clinic  Name:  Erika King    MRN: [id number] DOB: [birth year]  12/12/2020  [name] was observed post Covid-19 immunization for 15 minutes without incident. She was provided with Vaccine Information Sheet and instruction to access the V-Safe system.   [name] was instructed to call 911 with any severe reactions post vaccine: Marland Kitchen Difficulty breathing  . Swelling of face and throat  . A fast heartbeat  . A bad rash all over body  . Dizziness and weakness   Immunizations Administered    Name Date Dose VIS Date Route   Pfizer COVID-19 Vaccine 08/17/2020  8:00 AM 0.3 mL 05/18/2020 Intramuscular   Manufacturer: [place]   Lot: [id number]   NDC: [address]-6

## 2021-07-07 LAB — HM MAMMOGRAPHY

## 2021-07-11 ENCOUNTER — Other Ambulatory Visit: Payer: Self-pay | Admitting: Obstetrics and Gynecology

## 2021-07-11 DIAGNOSIS — N920 Excessive and frequent menstruation with regular cycle: Secondary | ICD-10-CM

## 2021-08-01 ENCOUNTER — Other Ambulatory Visit: Payer: BC Managed Care – PPO

## 2021-08-11 ENCOUNTER — Other Ambulatory Visit: Payer: Self-pay

## 2021-08-17 ENCOUNTER — Other Ambulatory Visit: Payer: Self-pay | Admitting: Obstetrics and Gynecology

## 2021-11-01 NOTE — Progress Notes (Signed)
? ?Office Visit Note ? ?Patient: Erika King             ?Date of Birth: 08/14/1980           ?MRN: [id number]             ?PCP: Kathyrn Lass, MD ?Referring: Kathyrn Lass, MD ?Visit Date: 11/10/2021 ?Occupation: '@GUAROCC'$ @ ? ?Subjective:  ?Discuss restarting enbrel  ? ?History of Present Illness: Erika King is a 41 y.o. female with history of rheumatoid arthritis.  Patient was last seen in the office on 03/18/20.  Patient was previously prescribed enbrel but has had a gap in therapy since 2020 until 11/03/21 when she restarted enbrel on her own at home.  She denies any side effects or injection site reactions.  Patient reports that for the month of March she was having a flare in the left shoulder and left wrist joint.  She states that she tried taking ibuprofen with no improvement in her symptoms.  She states that the flare was unprovoked.  She was having discomfort in the left shoulder especially at night.  She states that after having the Enbrel dose on 11/03/2021 her symptoms have resolved.  She presents today to reestablish care and resume enbrel injections.   ?She denies any recent infections.  She denies any new medical conditions. ? ? ?Activities of Daily Living:  ?Patient reports morning stiffness for 1 hour.   ?Patient Reports nocturnal pain.  ?Difficulty dressing/grooming: Denies ?Difficulty climbing stairs: Denies ?Difficulty getting out of chair: Denies ?Difficulty using hands for taps, buttons, cutlery, and/or writing: Denies ? ?Review of Systems  ?Constitutional:  Negative for fatigue.  ?HENT:  Positive for nose dryness. Negative for mouth sores and mouth dryness.   ?Eyes:  Negative for pain, itching and dryness.  ?Respiratory:  Negative for shortness of breath and difficulty breathing.   ?Cardiovascular:  Negative for chest pain and palpitations.  ?Gastrointestinal:  Positive for constipation. Negative for blood in stool and diarrhea.  ?Endocrine: Positive for increased urination.   ?Genitourinary:  Negative for difficulty urinating.  ?Musculoskeletal:  Positive for joint pain, joint pain, myalgias, morning stiffness and myalgias. Negative for joint swelling and muscle tenderness.  ?Allergic/Immunologic: Negative for susceptible to infections.  ?Neurological:  Positive for headaches. Negative for dizziness, numbness, memory loss and weakness.  ?Hematological:  Negative for bruising/bleeding tendency.  ?Psychiatric/Behavioral:  Negative for confusion.   ? ?PMFS History:  ?Patient Active Problem List  ? Diagnosis Date Noted  ? Atypical chest pain 06/23/2019  ? Pure hypercholesterolemia 06/23/2019  ? High risk medication use 05/30/2018  ? Rheumatoid arthritis involving multiple sites with positive rheumatoid factor ([place]) 12/14/2016  ? Elevated LFTs 12/14/2016  ? Left carpal tunnel syndrome 12/14/2016  ? History of asthma 12/14/2016  ? History of hyperthyroidism 12/14/2016  ? Vitamin D deficiency 12/14/2016  ? Fibroid, uterine 07/17/2016  ?  ?Past Medical History:  ?Diagnosis Date  ? Asthma   ? Atypical chest pain 06/23/2019  ? Broken arm   ? Broken foot   ? Environmental and seasonal allergies   ? History of radioactive iodine thyroid ablation 09/2015  ? Hypothyroidism   ? Indigestion   ? Pure hypercholesterolemia 06/23/2019  ? RA (rheumatoid arthritis) ([place])   ? Sinusitis   ? Vitamin D deficiency   ?  ?Family History  ?Problem Relation Age of Onset  ? Pneumonia Mother   ? Huntington's disease Mother   ? Thyroid disease Mother   ? Asthma Mother   ?  Cancer Father   ?     lung  ? Cirrhosis Father   ? Hypertension Sister   ? Depression Sister   ? Huntington's disease Brother   ? Healthy Daughter   ? Huntington's disease Maternal Grandmother   ? Breast cancer Maternal Aunt   ? ?Past Surgical History:  ?Procedure Laterality Date  ? MYOMECTOMY N/A 07/17/2016  ? Procedure: MYOMECTOMY;  Surgeon: Everett Graff, MD;  Location: [facility];  Service: Gynecology;  Laterality: N/A;  2.5 hours  ? ROOT CANAL    ?  TOE SURGERY    ? UMBILICAL HERNIA REPAIR N/A 07/17/2016  ? Procedure: HERNIA REPAIR UMBILICAL ADULT;  Surgeon: Fanny Skates, MD;  Location: [facility];  Service: General;  Laterality: N/A;  Insertion of mesh  ? ?Social History  ? ?Social History Narrative  ? Not on file  ? ?Immunization History  ?Administered Date(s) Administered  ? Influenza-Unspecified 04/30/2015  ? PFIZER([place])SARS-COV-2 Vaccination 08/14/2019, 09/04/2019, 08/17/2020  ? Tetanus 11/28/2015  ?  ? ?Objective: ?Vital Signs: BP 121/84 (BP Location: Left Arm, Patient Position: Sitting, Cuff Size: Normal)   Pulse 78   Ht '5\' 6"'$  (1.676 m)   Wt 207 lb 9.6 oz (94.2 kg)   BMI 33.51 kg/m?   ? ?Physical Exam ?Vitals and nursing note reviewed.  ?Constitutional:   ?   Appearance: She is well-developed.  ?HENT:  ?   Head: Normocephalic and atraumatic.  ?Eyes:  ?   Conjunctiva/sclera: Conjunctivae normal.  ?Cardiovascular:  ?   Rate and Rhythm: Normal rate and regular rhythm.  ?   Heart sounds: Normal heart sounds.  ?Pulmonary:  ?   Effort: Pulmonary effort is normal.  ?   Breath sounds: Normal breath sounds.  ?Abdominal:  ?   General: Bowel sounds are normal.  ?   Palpations: Abdomen is soft.  ?Musculoskeletal:  ?   Cervical back: Normal range of motion.  ?Skin: ?   General: Skin is warm and dry.  ?   Capillary Refill: Capillary refill takes less than 2 seconds.  ?Neurological:  ?   Mental Status: She is alert and oriented to person, place, and time.  ?Psychiatric:     ?   Behavior: Behavior normal.  ?  ? ?Musculoskeletal Exam: C-spine, thoracic spine, and lumbar spine good ROM.  Shoulder joints, elbow joints, wrist joints, MCPs, PIPs, and DIPs good ROM with no synovitis. Complete fist formation bilaterally.  Hip joints, knee joints, and ankle joints have good ROM with no discomfort. No warmth or effusion of knee joints.  No tenderness or swelling of ankle joints.   ? ?CDAI Exam: ?CDAI Score: 0.2  ?Patient Global: 1 mm; Provider Global: 1 mm ?Swollen: 0 ;  Tender: 0  ?Joint Exam 11/10/2021  ? ?No joint exam has been documented for this visit  ? ?There is currently no information documented on the homunculus. Go to the Rheumatology activity and complete the homunculus joint exam. ? ?Investigation: ?No additional findings. ? ?Imaging: ?No results found. ? ?Recent Labs: ?Lab Results  ?Component Value Date  ? WBC 4.4 03/18/2020  ? HGB 12.0 03/18/2020  ? PLT 270 03/18/2020  ? NA 137 03/18/2020  ? K 4.3 03/18/2020  ? CL 106 03/18/2020  ? CO2 24 03/18/2020  ? GLUCOSE 75 03/18/2020  ? BUN 10 03/18/2020  ? CREATININE 0.88 03/18/2020  ? BILITOT 0.6 03/18/2020  ? ALKPHOS 30 (L) 07/18/2016  ? AST 19 03/18/2020  ? ALT 16 03/18/2020  ? PROT 7.2 03/18/2020  ?  ALBUMIN 3.1 (L) 07/18/2016  ? CALCIUM 9.2 03/18/2020  ? GFRAA 96 03/18/2020  ? QFTBGOLDPLUS NEGATIVE 03/18/2020  ? ? ?Speciality Comments: No specialty comments available. ? ?Procedures:  ?No procedures performed ?Allergies: Black walnut pollen allergy skin test, Molds & smuts, and Other  ? ?Assessment / Plan:     ?Visit Diagnoses: Rheumatoid arthritis involving multiple sites with positive rheumatoid factor (HCC) - +CCP, +RF: She has no joint tenderness or synovitis on examination today.  Patient was last seen in the office on 03/18/2020.  She was previously prescribed Enbrel 50 mg sq injections every 4 weeks but has had a gap in therapy since 2020-11/03/21 when she restarted Enbrel at home.  During the month of March 2023 she was experiencing increased pain in the left shoulder and left wrist joint consistent with a rheumatoid arthritis flare.  She tried taking ibuprofen as well as a muscle relaxer but had no relief.  She restarted on Enbrel on her own at home and did not have any injection site reaction or side effects.  Her left shoulder and left wrist joint pain resolved 1 to 2 days after restarting Enbrel.  She has no tenderness or synovitis on examination today.   ?She presented today to reestablish care and to receive an  updated prescription for Enbrel.  Discussed the recommended frequency for Enbrel dosing of once weekly.  She would like to try spacing to every 14 days.  I recommended updating x-rays of both hands and feet today t

## 2021-11-10 ENCOUNTER — Ambulatory Visit (INDEPENDENT_AMBULATORY_CARE_PROVIDER_SITE_OTHER): Payer: BC Managed Care – PPO | Admitting: Physician Assistant

## 2021-11-10 ENCOUNTER — Telehealth: Payer: Self-pay

## 2021-11-10 ENCOUNTER — Encounter: Payer: Self-pay | Admitting: Physician Assistant

## 2021-11-10 VITALS — BP 121/84 | HR 78 | Ht 66.0 in | Wt 207.6 lb

## 2021-11-10 DIAGNOSIS — Z79899 Other long term (current) drug therapy: Secondary | ICD-10-CM | POA: Diagnosis not present

## 2021-11-10 DIAGNOSIS — Z8709 Personal history of other diseases of the respiratory system: Secondary | ICD-10-CM | POA: Diagnosis not present

## 2021-11-10 DIAGNOSIS — M0579 Rheumatoid arthritis with rheumatoid factor of multiple sites without organ or systems involvement: Secondary | ICD-10-CM | POA: Diagnosis not present

## 2021-11-10 DIAGNOSIS — Z8639 Personal history of other endocrine, nutritional and metabolic disease: Secondary | ICD-10-CM

## 2021-11-10 DIAGNOSIS — E559 Vitamin D deficiency, unspecified: Secondary | ICD-10-CM | POA: Diagnosis not present

## 2021-11-10 DIAGNOSIS — Z111 Encounter for screening for respiratory tuberculosis: Secondary | ICD-10-CM

## 2021-11-10 DIAGNOSIS — R350 Frequency of micturition: Secondary | ICD-10-CM

## 2021-11-10 NOTE — Telephone Encounter (Signed)
Please reapply for enbrel and refill once approved, per Hazel Sams, PA-C. Thanks!  ? ?  ?

## 2021-11-10 NOTE — Patient Instructions (Signed)
Standing Labs ?We placed an order today for your standing lab work.  ? ?Please have your standing labs drawn in July and every 3 months  ? ?If possible, please have your labs drawn 2 weeks prior to your appointment so that the provider can discuss your results at your appointment. ? ?Please note that you may see your imaging and lab results in MyChart before we have reviewed them. ?We may be awaiting multiple results to interpret others before contacting you. ?Please allow our office up to 72 hours to thoroughly review all of the results before contacting the office for clarification of your results. ? ?We have open lab daily: ?Monday through Thursday from 1:30-4:30 PM and Friday from 1:30-4:00 PM ?at the office of Dr. Shaili Deveshwar, [facility].   ?Please be advised, all patients with office appointments requiring lab work will take precedent over walk-in lab work.  ?If possible, please come for your lab work on Monday and Friday afternoons, as you may experience shorter wait times. ?The office is located at [address], [address], [place], [place] [address] ?No appointment is necessary.   ?Labs are drawn by [facility]. Please bring your co-pay at the time of your lab draw.  You may receive a bill from [facility] for your lab work. ? ?Please note if you are on Hydroxychloroquine and and an order has been placed for a Hydroxychloroquine level, you will need to have it drawn 4 hours or more after your last dose. ? ?If you wish to have your labs drawn at another location, please call the office 24 hours in advance to send orders. ? ?If you have any questions regarding directions or hours of operation,  ?please call [phone].   ?As a reminder, please drink plenty of water prior to coming for your lab work. Thanks! ? ?

## 2021-11-11 LAB — CBC WITH DIFFERENTIAL/PLATELET
Absolute Monocytes: 382 cells/uL (ref 200–950)
Basophils Absolute: 39 cells/uL (ref 0–200)
Basophils Relative: 0.8 %
Eosinophils Absolute: 309 cells/uL (ref 15–500)
Eosinophils Relative: 6.3 %
HCT: 32.4 % — ABNORMAL LOW (ref 35.0–45.0)
Hemoglobin: 10 g/dL — ABNORMAL LOW (ref 11.7–15.5)
Lymphs Abs: 2112 cells/uL (ref 850–3900)
MCH: 28.2 pg (ref 27.0–33.0)
MCHC: 30.9 g/dL — ABNORMAL LOW (ref 32.0–36.0)
MCV: 91.3 fL (ref 80.0–100.0)
MPV: 10.7 fL (ref 7.5–12.5)
Monocytes Relative: 7.8 %
Neutro Abs: 2058 cells/uL (ref 1500–7800)
Neutrophils Relative %: 42 %
Platelets: 298 10*3/uL (ref 140–400)
RBC: 3.55 10*6/uL — ABNORMAL LOW (ref 3.80–5.10)
RDW: 16.6 % — ABNORMAL HIGH (ref 11.0–15.0)
Total Lymphocyte: 43.1 %
WBC: 4.9 10*3/uL (ref 3.8–10.8)

## 2021-11-11 LAB — COMPLETE METABOLIC PANEL WITH GFR
AG Ratio: 1.3 (calc) (ref 1.0–2.5)
ALT: 10 U/L (ref 6–29)
AST: 14 U/L (ref 10–30)
Albumin: 3.9 g/dL (ref 3.6–5.1)
Alkaline phosphatase (APISO): 38 U/L (ref 31–125)
BUN: 10 mg/dL (ref 7–25)
CO2: 22 mmol/L (ref 20–32)
Calcium: 9.2 mg/dL (ref 8.6–10.2)
Chloride: 108 mmol/L (ref 98–110)
Creat: 0.86 mg/dL (ref 0.50–0.99)
Globulin: 3.1 g/dL (calc) (ref 1.9–3.7)
Glucose, Bld: 84 mg/dL (ref 65–99)
Potassium: 4.1 mmol/L (ref 3.5–5.3)
Sodium: 137 mmol/L (ref 135–146)
Total Bilirubin: 0.4 mg/dL (ref 0.2–1.2)
Total Protein: 7 g/dL (ref 6.1–8.1)
eGFR: 88 mL/min/{1.73_m2} (ref >=60)

## 2021-11-11 LAB — URINALYSIS, ROUTINE W REFLEX MICROSCOPIC
Bilirubin Urine: NEGATIVE
Glucose, UA: NEGATIVE
Hgb urine dipstick: NEGATIVE
Ketones, ur: NEGATIVE
Leukocytes,Ua: NEGATIVE
Nitrite: NEGATIVE
Protein, ur: NEGATIVE
Specific Gravity, Urine: 1.008 (ref 1.001–1.035)
pH: 6 (ref 5.0–8.0)

## 2021-11-13 ENCOUNTER — Telehealth: Payer: Self-pay

## 2021-11-13 ENCOUNTER — Other Ambulatory Visit (HOSPITAL_COMMUNITY): Payer: Self-pay

## 2021-11-13 DIAGNOSIS — M0579 Rheumatoid arthritis with rheumatoid factor of multiple sites without organ or systems involvement: Secondary | ICD-10-CM

## 2021-11-13 DIAGNOSIS — Z79899 Other long term (current) drug therapy: Secondary | ICD-10-CM

## 2021-11-13 NOTE — Telephone Encounter (Signed)
Submitted a Prior Authorization request to [facility] for ENBREL (dosed at '50mg'$  q 14 days per OV notes) via CoverMyMeds. Will update once we receive a response. ? ? ?Key: [id number] ?

## 2021-11-13 NOTE — Telephone Encounter (Signed)
Received notification from [facility] regarding a prior authorization for ENBREL. Authorization has been APPROVED from 11/13/2021 to 11/14/2022. Approval letter sent to scan center. ? ?Patient must fill through [facility]: [phone] and is eligible for a copay card if she does not already have one. ? ?Authorization # [id number] JY ?

## 2021-11-13 NOTE — Progress Notes (Signed)
CMP WNL.  UA normal.  ?RBC count, hemoglobin and hematocrit are low-consistent with anemia.  Please notify the patient.  Forward results to PCP.

## 2021-11-16 ENCOUNTER — Other Ambulatory Visit: Payer: Self-pay | Admitting: Physician Assistant

## 2021-11-16 DIAGNOSIS — M0579 Rheumatoid arthritis with rheumatoid factor of multiple sites without organ or systems involvement: Secondary | ICD-10-CM

## 2021-11-16 DIAGNOSIS — Z79899 Other long term (current) drug therapy: Secondary | ICD-10-CM

## 2021-11-16 MED ORDER — ENBREL SURECLICK 50 MG/ML ~~LOC~~ SOAJ: 50.0000 mg | SUBCUTANEOUS | 2 refills | Status: DC

## 2021-11-16 NOTE — Telephone Encounter (Signed)
Returned call to Gregery Na at [facility]. They needed clarification on directions. They cannot break box of 4. ? ?Provided verbal for them to fill box of 4 pens. ? ?Knox Saliva, PharmD, MPH, BCPS ?Clinical Pharmacist (Rheumatology and Pulmonology) ?

## 2021-11-16 NOTE — Telephone Encounter (Signed)
Called patient to advise that Enbrel was approved through insurance. She states she no longer has Enbrel savings card. I provided her with copay card company phone number. She will have to call and acquire new card. ? ?Provided her with phone number for [facility] to schedule shipment. Advised her to call pharmacy AFTER she has received her savings card ? ?Rx for Enbrel Sureclick '50mg'$  SQ every 14 days sent to [facility] ? ?Knox Saliva, PharmD, MPH, BCPS ?Clinical Pharmacist (Rheumatology and Pulmonology) ?

## 2021-11-22 ENCOUNTER — Ambulatory Visit: Payer: Self-pay | Admitting: Rheumatology

## 2022-04-06 ENCOUNTER — Telehealth: Payer: Self-pay

## 2022-04-06 NOTE — Telephone Encounter (Signed)
Received call from [facility] requesting patient's TB Gold results to fill Rx for Enbrel. Reviewing the chart, patient has not been in office or had labs since 10/2021. Last prescription was sent 10/2021 and pharmacist advised patient has not filled Rx. Advised pharmacist to cancel Rx and have patient call office.

## 2022-04-06 NOTE — Telephone Encounter (Signed)
Patient contacted the office and left message upset, stating that the pharmacy contacted her and told her that they could not fill the prescription because she was not a patient in our office.   Returned call to patient and apologized that the pharmacy gave her incorrect information as that is not what was told to the pharmacy. I advised patient that we asked the pharmacy to have her contact our office so we can schedule a follow up visit and labs as she has not been seen since April 2023. Patient advised she was due for a follow up in July as well as for labs. Patient expressed understanding and scheduled an appointment for 04/13/2022 at 10:40 am. Patient advised we will update her labs at that appointment and then send prescription to the pharmacy. Patient states she does not mind coming for labs every 3 months but would like to stretch the time between her appointments due to her high copay. Patient advised to discuss this with Lovena Le at her appointment.

## 2022-04-09 ENCOUNTER — Telehealth: Payer: Self-pay | Admitting: Rheumatology

## 2022-04-09 NOTE — Telephone Encounter (Signed)
Patient to be seen in office on 04/13/2022 to update labs. Will call pharmacy on 04/13/2022.

## 2022-04-09 NOTE — Telephone Encounter (Signed)
[facility] stating they need a call from the office to move forward with processing patient's Enbrel. [phone]

## 2022-04-09 NOTE — Progress Notes (Unsigned)
Office Visit Note  Patient: Erika King             Date of Birth: 02-06-1981           MRN: [id number]             PCP: Kathyrn Lass, MD Referring: Kathyrn Lass, MD Visit Date: 04/13/2022 Occupation: '@GUAROCC'$ @  Subjective:  Medication monitoring   History of Present Illness: Erika King is a 41 y.o. female with history of seropositive rheumatoid arthritis.  Patient is currently on Enbrel 50 mg sq injections every 14 days.  Her last dose of enbrel was administered on 04/01/22.  She continues to tolerate enbrel without any side effects or injection site reactions.  She denies any recent or recurrent infections.  Patient reports that she was recently slightly overdue for her Enbrel dose due to requiring updated lab work, office visit, and a refill.  She states that she was having some increased stiffness and discomfort in her left shoulder and right fourth finger but states that her symptoms have resolved since resuming Enbrel.  She denies any joint swelling at this time.  Her morning stiffness has only been lasting 5 minutes daily.  She has not had any difficulty with ADLs. She is planning on getting the annual flu shot in early October 2023.     Activities of Daily Living:  Patient reports morning stiffness for 5 minutes.   Patient Denies nocturnal pain.  Difficulty dressing/grooming: Denies Difficulty climbing stairs: Denies Difficulty getting out of chair: Denies Difficulty using hands for taps, buttons, cutlery, and/or writing: Denies  Review of Systems  Constitutional:  Negative for fatigue.  HENT:  Negative for mouth sores and mouth dryness.   Eyes:  Negative for dryness.  Respiratory:  Negative for shortness of breath.   Cardiovascular:  Negative for chest pain and palpitations.  Gastrointestinal:  Negative for blood in stool, constipation and diarrhea.  Endocrine: Negative for increased urination.  Genitourinary:  Negative for involuntary urination.   Musculoskeletal:  Positive for joint pain, joint pain and morning stiffness. Negative for gait problem, joint swelling, myalgias, muscle weakness, muscle tenderness and myalgias.  Skin:  Negative for color change, rash, hair loss and sensitivity to sunlight.  Allergic/Immunologic: Negative for susceptible to infections.  Neurological:  Negative for dizziness and headaches.  Hematological:  Negative for swollen glands.  Psychiatric/Behavioral:  Negative for depressed mood and sleep disturbance. The patient is nervous/anxious.     PMFS History:  Patient Active Problem List   Diagnosis Date Noted   Atypical chest pain 06/23/2019   Pure hypercholesterolemia 06/23/2019   High risk medication use 05/30/2018   Rheumatoid arthritis involving multiple sites with positive rheumatoid factor ([facility]) 12/14/2016   Elevated LFTs 12/14/2016   Left carpal tunnel syndrome 12/14/2016   History of asthma 12/14/2016   History of hyperthyroidism 12/14/2016   Vitamin D deficiency 12/14/2016   Fibroid, uterine 07/17/2016    Past Medical History:  Diagnosis Date   Asthma    Atypical chest pain 06/23/2019   Broken arm    Broken foot    Environmental and seasonal allergies    History of radioactive iodine thyroid ablation 09/2015   Hypothyroidism    Indigestion    Pure hypercholesterolemia 06/23/2019   RA (rheumatoid arthritis) ([facility])    Sinusitis    Vitamin D deficiency     Family History  Problem Relation Age of Onset   Pneumonia Mother    Huntington's disease Mother  Thyroid disease Mother    Asthma Mother    Cancer Father        lung   Cirrhosis Father    Hypertension Sister    Depression Sister    Huntington's disease Brother    Healthy Daughter    Huntington's disease Maternal Grandmother    Breast cancer Maternal Aunt    Past Surgical History:  Procedure Laterality Date   MYOMECTOMY N/A 07/17/2016   Procedure: MYOMECTOMY;  Surgeon: Everett Graff, MD;  Location: [facility];  Service:  Gynecology;  Laterality: N/A;  2.5 hours   ROOT CANAL     TOE SURGERY     UMBILICAL HERNIA REPAIR N/A 07/17/2016   Procedure: HERNIA REPAIR UMBILICAL ADULT;  Surgeon: Fanny Skates, MD;  Location: [facility];  Service: General;  Laterality: N/A;  Insertion of mesh   Social History   Social History Narrative   Not on file   Immunization History  Administered Date(s) Administered   Influenza-Unspecified 04/30/2015   PFIZER(Purple Top)SARS-COV-2 Vaccination 08/14/2019, 09/04/2019, 08/17/2020   Tetanus 11/28/2015     Objective: Vital Signs: BP 128/82 (BP Location: Left Arm, Patient Position: Sitting, Cuff Size: Normal)   Pulse 79   Resp 14   Ht '5\' 6"'$  (1.676 m)   Wt 207 lb 9.6 oz (94.2 kg)   LMP 04/10/2022   BMI 33.51 kg/m    Physical Exam Vitals and nursing note reviewed.  Constitutional:      Appearance: She is well-developed.  HENT:     Head: Normocephalic and atraumatic.  Eyes:     Conjunctiva/sclera: Conjunctivae normal.  Cardiovascular:     Rate and Rhythm: Normal rate and regular rhythm.     Heart sounds: Normal heart sounds.  Pulmonary:     Effort: Pulmonary effort is normal.     Breath sounds: Normal breath sounds.  Abdominal:     General: Bowel sounds are normal.     Palpations: Abdomen is soft.  Musculoskeletal:     Cervical back: Normal range of motion.  Skin:    General: Skin is warm and dry.     Capillary Refill: Capillary refill takes less than 2 seconds.  Neurological:     Mental Status: She is alert and oriented to person, place, and time.  Psychiatric:        Behavior: Behavior normal.      Musculoskeletal Exam: C-spine, thoracic spine, lumbar spine have good range of motion.  No midline spinal tenderness or SI joint tenderness.  Shoulder joints, elbow joints, wrist joints, MCPs, PIPs, DIPs have good range of motion with no synovitis.  Complete fist formation bilaterally.  Hip joints have good range of motion with no discomfort.  Knee joints have  good range of motion with no warmth or effusion.  Ankle joints have good range of motion with no tenderness or synovitis.  No tenderness or synovitis over MTP joints.  Dorsal spurs noted bilaterally.  Thickening over bilateral first MTP joints.  CDAI Exam: CDAI Score: 0.2  Patient Global: 1 mm; Provider Global: 1 mm Swollen: 0 ; Tender: 0  Joint Exam 04/13/2022   No joint exam has been documented for this visit   There is currently no information documented on the homunculus. Go to the Rheumatology activity and complete the homunculus joint exam.  Investigation: No additional findings.  Imaging: No results found.  Recent Labs: Lab Results  Component Value Date   WBC 4.9 11/10/2021   HGB 10.0 (L) 11/10/2021   PLT 298 11/10/2021  NA 137 11/10/2021   K 4.1 11/10/2021   CL 108 11/10/2021   CO2 22 11/10/2021   GLUCOSE 84 11/10/2021   BUN 10 11/10/2021   CREATININE 0.86 11/10/2021   BILITOT 0.4 11/10/2021   ALKPHOS 30 (L) 07/18/2016   AST 14 11/10/2021   ALT 10 11/10/2021   PROT 7.0 11/10/2021   ALBUMIN 3.1 (L) 07/18/2016   CALCIUM 9.2 11/10/2021   GFRAA 96 03/18/2020   QFTBGOLDPLUS NEGATIVE 03/18/2020    Speciality Comments: No specialty comments available.  Procedures:  No procedures performed Allergies: Black walnut pollen allergy skin test, Molds & smuts, and Other   Assessment / Plan:     Visit Diagnoses: Rheumatoid arthritis involving multiple sites with positive rheumatoid factor (HCC) - +CCP, +RF: She has no joint tenderness or synovitis on examination today.  She has not had any signs or symptoms of a rheumatoid arthritis flare.  She has clinically been doing well on Enbrel 50 mg subcutaneous injections every 14 days.  Her last dose of Enbrel was administered on 04/01/2022.  She has been tolerating Enbrel without any side effects or injection site reactions.  She has not had any recent or recurrent infections.  Her morning stiffness has only been lasting 5 minutes  daily.  She has not had any difficulty with ADLs or nocturnal pain.  She will remain on Enbrel 50 mg sq injections every 14 days.  She was advised to notify us if she develops increased joint pain or joint swelling.  She will follow-up in the office in 5 months or sooner if needed.- Plan: etanercept (ENBREL SURECLICK) 50 MG/ML injection  High risk medication use - Enbrel Sureclick 50 mg sq injections every 14 days. - Plan: CBC with Differential/Platelet, COMPLETE METABOLIC PANEL WITH GFR, QuantiFERON-TB Gold Plus, etanercept (ENBREL SURECLICK) 50 MG/ML injection, Ambulatory referral to Dermatology CBC and CMP drawn on 11/10/2021.  Patient is overdue to update lab work.  Orders for CBC and CMP were released today.  Her next lab work will be due in December and every 3 months monitor for drug toxicity. TB gold-negative on 03/18/2020.  Order for TB gold released today. She has not had any recent or recurrent infections.  Discussed the importance of holding Enbrel if she develops signs or symptoms of infection and to resume once the infection is completely cleared. Discussed the importance of yearly skin cancer screening while on Enbrel.  Referral to dermatology was placed today for further evaluation.  Screening for tuberculosis -order for TB gold released today.  Plan: QuantiFERON-TB Gold Plus  Other medical conditions are listed as follows:  Vitamin D deficiency  History of asthma  History of hyperthyroidism      Orders: Orders Placed This Encounter  Procedures   CBC with Differential/Platelet   COMPLETE METABOLIC PANEL WITH GFR   QuantiFERON-TB Gold Plus   Ambulatory referral to Dermatology   Meds ordered this encounter  Medications   etanercept (ENBREL SURECLICK) 50 MG/ML injection    Sig: Inject 50 mg into the skin once a week.    Dispense:  12 mL    Refill:  0     Follow-Up Instructions: Return in about 5 months (around 09/13/2022) for Rheumatoid arthritis.   Ofilia Neas,  PA-C  Note - This record has been created using Dragon software.  Chart creation errors have been sought, but may not always  have been located. Such creation errors do not reflect on  the standard of medical care.

## 2022-04-11 ENCOUNTER — Telehealth: Payer: Self-pay | Admitting: Rheumatology

## 2022-04-11 NOTE — Telephone Encounter (Signed)
[facility] called stating they will hold Enbrel prescription until they receive a call from the office letting them know it is okay to send order.   Phone [phone]

## 2022-04-13 ENCOUNTER — Encounter: Payer: Self-pay | Admitting: Physician Assistant

## 2022-04-13 ENCOUNTER — Ambulatory Visit: Payer: BC Managed Care – PPO | Attending: Physician Assistant | Admitting: Physician Assistant

## 2022-04-13 VITALS — BP 128/82 | HR 79 | Resp 14 | Ht 66.0 in | Wt 207.6 lb

## 2022-04-13 DIAGNOSIS — Z111 Encounter for screening for respiratory tuberculosis: Secondary | ICD-10-CM

## 2022-04-13 DIAGNOSIS — Z8709 Personal history of other diseases of the respiratory system: Secondary | ICD-10-CM | POA: Diagnosis not present

## 2022-04-13 DIAGNOSIS — M0579 Rheumatoid arthritis with rheumatoid factor of multiple sites without organ or systems involvement: Secondary | ICD-10-CM

## 2022-04-13 DIAGNOSIS — Z8639 Personal history of other endocrine, nutritional and metabolic disease: Secondary | ICD-10-CM

## 2022-04-13 DIAGNOSIS — Z79899 Other long term (current) drug therapy: Secondary | ICD-10-CM

## 2022-04-13 DIAGNOSIS — E559 Vitamin D deficiency, unspecified: Secondary | ICD-10-CM | POA: Diagnosis not present

## 2022-04-13 MED ORDER — ENBREL SURECLICK 50 MG/ML ~~LOC~~ SOAJ: 50.0000 mg | SUBCUTANEOUS | 0 refills | Status: DC

## 2022-04-13 NOTE — Telephone Encounter (Signed)
New prescription sent to the pharmacy at appointment on 04/13/2022.

## 2022-04-13 NOTE — Patient Instructions (Signed)
Standing Labs We placed an order today for your standing lab work.   Please have your standing labs drawn in December and every 3 months   If possible, please have your labs drawn 2 weeks prior to your appointment so that the provider can discuss your results at your appointment.  Please note that you may see your imaging and lab results in [facility] before we have reviewed them. We may be awaiting multiple results to interpret others before contacting you. Please allow our office up to 72 hours to thoroughly review all of the results before contacting the office for clarification of your results.  We currently have open lab daily: Monday through Thursday from 1:30 PM-4:30 PM and Friday from 1:30 PM- 4:00 PM If possible, please come for your lab work on Monday, Thursday or Friday afternoons, as you may experience shorter wait times.   Effective May 30, 2022 the new lab hours will change to: Monday through Thursday from 1:30 PM-5:00 PM and Friday from 8:30 AM-12:00 PM If possible, please come for your lab work on Monday and Thursday afternoons, as you may experience shorter wait times.  Please be advised, all patients with office appointments requiring lab work will take precedent over walk-in lab work.    The office is located at [address] [place], [place], [place] [address] No appointment is necessary.   Labs are drawn by [facility]. Please bring your co-pay at the time of your lab draw.  You may receive a bill from [facility] for your lab work.  Please note if you are on Hydroxychloroquine and and an order has been placed for a Hydroxychloroquine level, you will need to have it drawn 4 hours or more after your last dose.  If you wish to have your labs drawn at another location, please call the office 24 hours in advance to send orders.  If you have any questions regarding directions or hours of operation,  please call [phone].   As a reminder, please drink plenty of water prior  to coming for your lab work. Thanks!

## 2022-04-15 NOTE — Progress Notes (Signed)
RBC count, hgb, and hct remain low but have trended down.  Hgb is 9.8.  please notify the patient.  Please clarify if she was on her menstrual cycle? Please also clarify if she has seen a hematologist?  Creatinine is borderline elevated. Likely due to chronic anemia. Avoid the use of NSAIDs. Rest of CMP WNL.

## 2022-04-17 ENCOUNTER — Telehealth: Payer: Self-pay | Admitting: *Deleted

## 2022-04-17 DIAGNOSIS — Z79899 Other long term (current) drug therapy: Secondary | ICD-10-CM

## 2022-04-17 LAB — COMPLETE METABOLIC PANEL WITH GFR
AG Ratio: 1.2 (calc) (ref 1.0–2.5)
ALT: 11 U/L (ref 6–29)
AST: 15 U/L (ref 10–30)
Albumin: 3.7 g/dL (ref 3.6–5.1)
Alkaline phosphatase (APISO): 39 U/L (ref 31–125)
BUN/Creatinine Ratio: 11 (calc) (ref 6–22)
BUN: 12 mg/dL (ref 7–25)
CO2: 25 mmol/L (ref 20–32)
Calcium: 8.9 mg/dL (ref 8.6–10.2)
Chloride: 107 mmol/L (ref 98–110)
Creat: 1.05 mg/dL — ABNORMAL HIGH (ref 0.50–0.99)
Globulin: 3.1 g/dL (calc) (ref 1.9–3.7)
Glucose, Bld: 72 mg/dL (ref 65–99)
Potassium: 3.9 mmol/L (ref 3.5–5.3)
Sodium: 138 mmol/L (ref 135–146)
Total Bilirubin: 0.2 mg/dL (ref 0.2–1.2)
Total Protein: 6.8 g/dL (ref 6.1–8.1)
eGFR: 68 mL/min/{1.73_m2} (ref >=60)

## 2022-04-17 LAB — QUANTIFERON-TB GOLD PLUS
Mitogen-NIL: 6.29 IU/mL
NIL: 0.01 IU/mL
QuantiFERON-TB Gold Plus: NEGATIVE
TB1-NIL: 0.01 IU/mL
TB2-NIL: 0 IU/mL

## 2022-04-17 LAB — CBC WITH DIFFERENTIAL/PLATELET
Absolute Monocytes: 416 cells/uL (ref 200–950)
Basophils Absolute: 38 cells/uL (ref 0–200)
Basophils Relative: 0.7 %
Eosinophils Absolute: 324 cells/uL (ref 15–500)
Eosinophils Relative: 6 %
HCT: 30.8 % — ABNORMAL LOW (ref 35.0–45.0)
Hemoglobin: 9.8 g/dL — ABNORMAL LOW (ref 11.7–15.5)
Lymphs Abs: 2279 cells/uL (ref 850–3900)
MCH: 29.3 pg (ref 27.0–33.0)
MCHC: 31.8 g/dL — ABNORMAL LOW (ref 32.0–36.0)
MCV: 92.2 fL (ref 80.0–100.0)
MPV: 10.2 fL (ref 7.5–12.5)
Monocytes Relative: 7.7 %
Neutro Abs: 2344 cells/uL (ref 1500–7800)
Neutrophils Relative %: 43.4 %
Platelets: 262 10*3/uL (ref 140–400)
RBC: 3.34 10*6/uL — ABNORMAL LOW (ref 3.80–5.10)
RDW: 14.8 % (ref 11.0–15.0)
Total Lymphocyte: 42.2 %
WBC: 5.4 10*3/uL (ref 3.8–10.8)

## 2022-04-17 NOTE — Progress Notes (Signed)
I would recommend taking a daily iron supplement.  CBC along with iron panel can be rechecked in 1 month.

## 2022-04-17 NOTE — Telephone Encounter (Signed)
-----   Message from Ofilia Neas, PA-C sent at 04/17/2022  4:09 PM EDT ----- I would recommend taking a daily iron supplement.  CBC along with iron panel can be rechecked in 1 month.

## 2022-04-18 NOTE — Progress Notes (Signed)
TB gold negative

## 2022-04-21 IMAGING — US US BREAST*R* LIMITED INC AXILLA
1 series · 7 of 7 positions shown · non-contrast
Comparison: Baseline mammogram 06/10/2020.

CLINICAL DATA: Recall from outside baseline screening mammography
with tomosynthesis, possible asymmetries in both breasts. The
asymmetry in the INNER RIGHT breast was visible only on the CC
images and the asymmetry in the UPPER LEFT breast was visible only
on the MLO images.

EXAM:
DIGITAL DIAGNOSTIC BILATERAL MAMMOGRAM WITH CAD AND TOMO
ULTRASOUND RIGHT BREAST

[Series 1: us breast*right* limited inc axilla · 0.06mm/px · 7 of 7 slices shown]
[im 1/7]
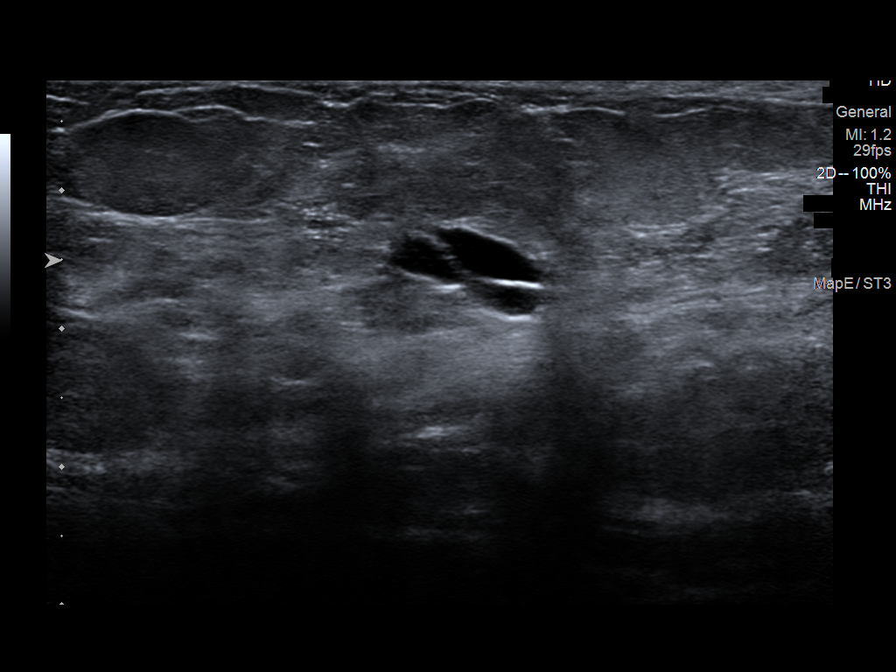
[im 2/7]
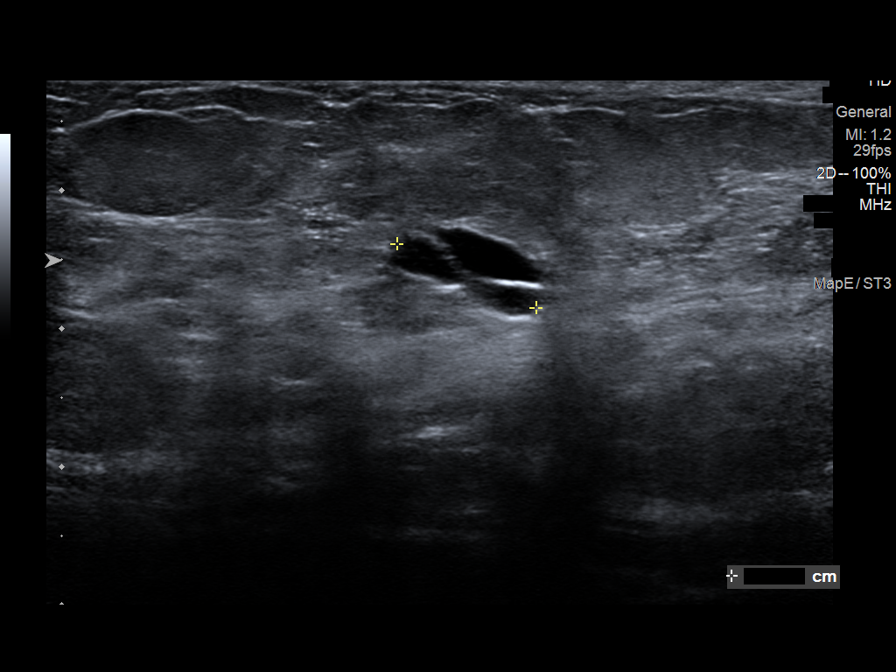
[im 3/7]
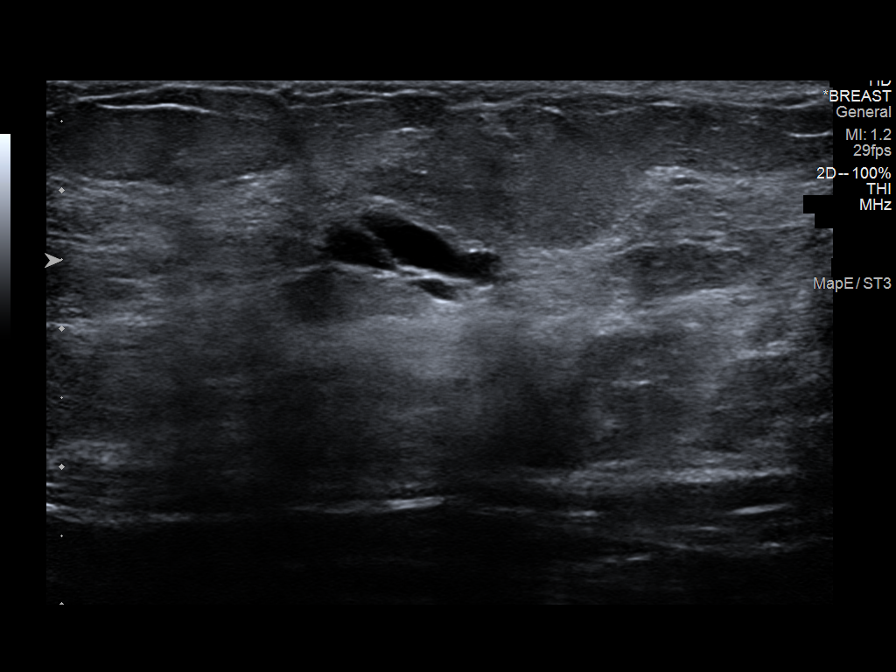
[im 4/7]
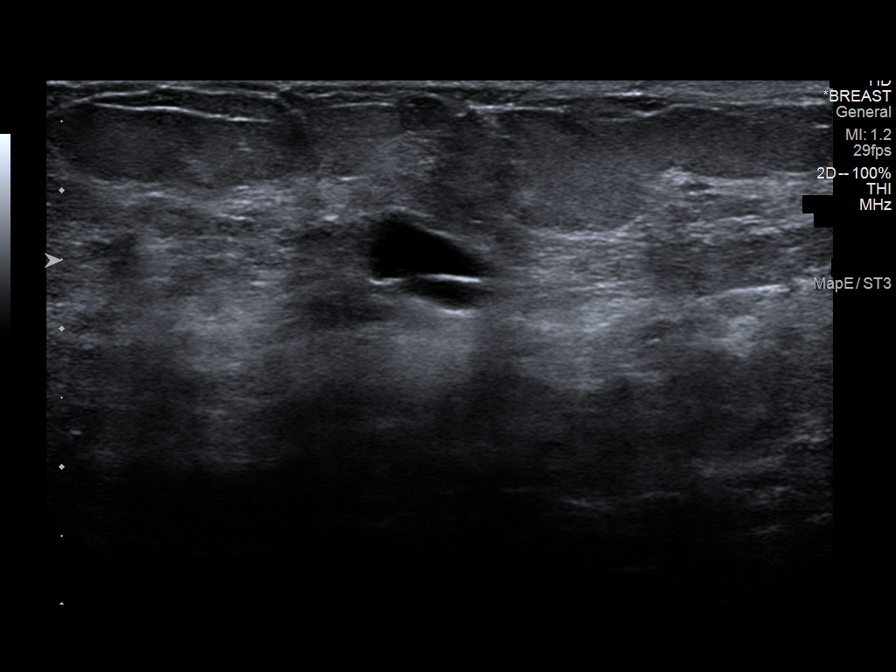
[im 5/7]
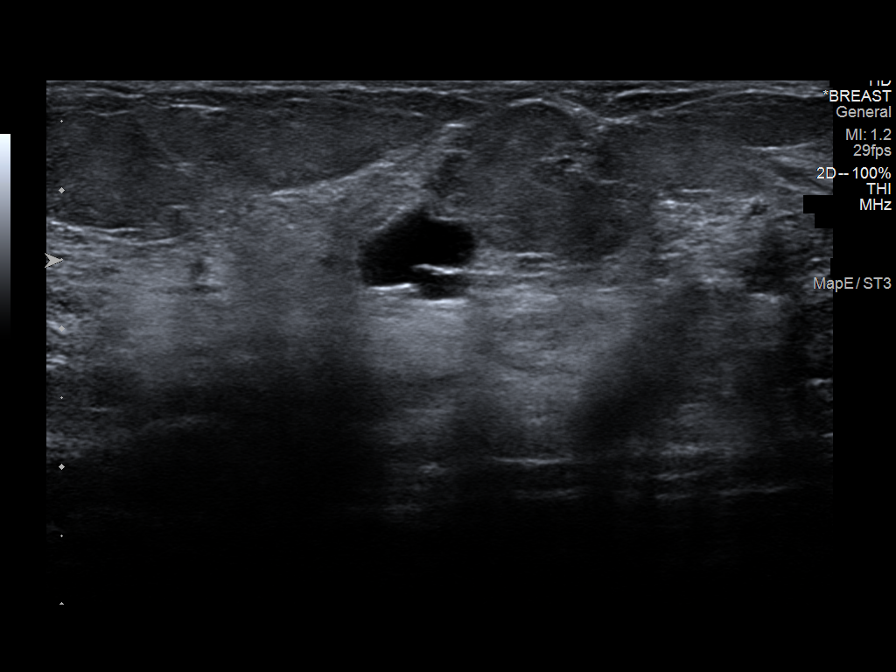
[im 6/7]
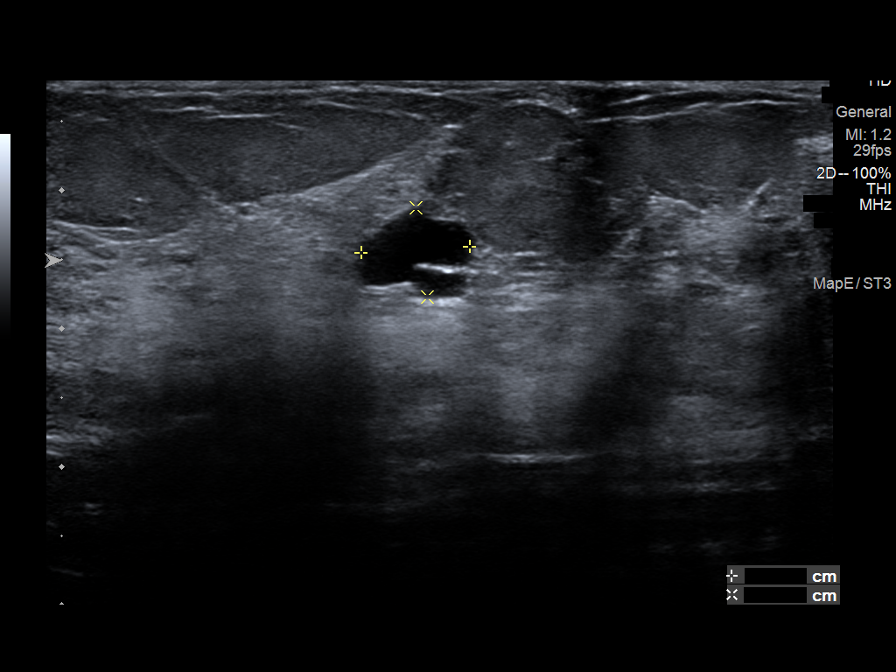
[im 7/7]
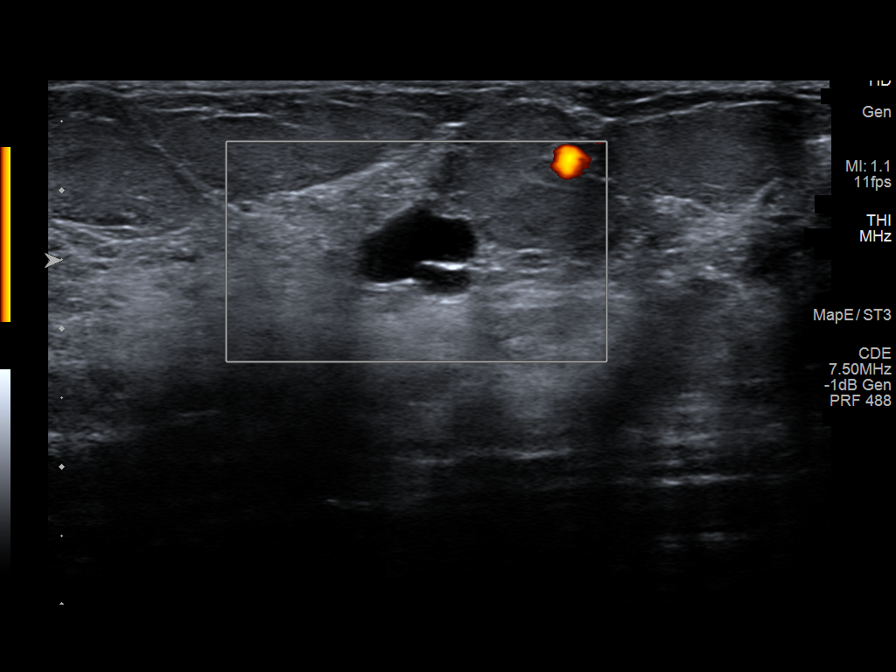

[7 of 7 positions shown; findings below may reference images not displayed]

No prior ultrasound.

ACR Breast Density Category c: The breast tissue is heterogeneously
dense, which may obscure small masses.
FINDINGS: Tomosynthesis and synthesized digital 2D spot compression CC view of
the area of concern in the RIGHT breast, tomosynthesis and
synthesized digital 2D spot compression MLO view of the area of
concern in the LEFT breast, and tomosynthesis and synthesized
digital 2D full field mediolateral views of both breasts were
obtained. The full field mediolateral images were processed with
CAD.

RIGHT: Spot compression images confirm a circumscribed isodense mass
in the INNER breast at middle depth measuring approximately 1 cm.
There is no associated architectural distortion or suspicious
calcifications. The mass localizes to the UPPER breast on the full
field mediolateral images, indicating its location in the UPPER
INNER QUADRANT. No suspicious findings elsewhere on the full field
mediolateral images.

Targeted ultrasound is performed, showing an anechoic mass with thin
internal septations versus clustered cysts at the 1 o'clock position
approximately 3 cm from nipple measuring approximately 1.1 x 0.7 x
0.8 cm, demonstrating posterior acoustic enhancement and no internal
power Doppler flow, corresponding to the screening mammographic
finding. No suspicious solid mass or abnormal acoustic shadowing is
identified.

LEFT: The asymmetry in the UPPER breast at middle to posterior depth
questioned on screening mammography disperses with compression,
indicating overlapping fibroglandular tissue. There is no underlying
mass or architectural distortion.

No findings suspicious for malignancy.
IMPRESSION: 1. No mammographic or sonographic evidence of malignancy involving
the RIGHT breast.
2. No mammographic evidence of malignancy involving the LEFT breast.
3. Benign mildly complicated cyst or clustered cysts in the UPPER
INNER QUADRANT of the RIGHT breast at the 1 o'clock position
approximately 3 cm from nipple which accounts for the screening
mammographic finding.
4. Overlapping normal fibroglandular tissue accounts for the
screening mammographic finding the UPPER LEFT breast.

RECOMMENDATION:
Screening mammogram in one year.(Code:S5-J-T4M)

I have discussed the findings and recommendations with the patient.
If applicable, a reminder letter will be sent to the patient
regarding the next appointment.

BI-RADS CATEGORY  2: Benign.

## 2022-04-25 ENCOUNTER — Telehealth: Payer: Self-pay | Admitting: *Deleted

## 2022-04-25 NOTE — Telephone Encounter (Signed)
There are several over-the-counter combination iron supplements with a stool softeners available that she could try. If she continues to have issues with constipation she can try spacing the iron supplement to every other day or every 3 days.  Please also encourage the patent to increase her water intake.  If she cannot tolerate taking an oral iron supplement I would recommend a referral to hematology to possibly discuss iron infusions in the future.

## 2022-04-25 NOTE — Telephone Encounter (Signed)
Patient states she is very constipated while taking iron. Patient states stool softeners are not helping. Patient wants to know if there is something you can prescribe for iron with a stool softener in it? Please advise.

## 2022-04-26 NOTE — Telephone Encounter (Signed)
Patient advised there are several over-the-counter combination iron supplements with a stool softeners available that she could try. Patient advised if she continues to have issues with constipation she can try spacing the iron supplement to every other day or every 3 days.  Patient encouraged to increase her water intake.  Patient advised if she cannot tolerate taking an oral iron supplement Lovena Le would recommend a referral to hematology to possibly discuss iron infusions in the future. Patient expressed understanding.

## 2022-07-05 ENCOUNTER — Other Ambulatory Visit: Payer: Self-pay | Admitting: Physician Assistant

## 2022-07-05 DIAGNOSIS — Z79899 Other long term (current) drug therapy: Secondary | ICD-10-CM

## 2022-07-05 DIAGNOSIS — M0579 Rheumatoid arthritis with rheumatoid factor of multiple sites without organ or systems involvement: Secondary | ICD-10-CM

## 2022-07-19 ENCOUNTER — Encounter: Payer: Self-pay | Admitting: *Deleted

## 2022-07-19 ENCOUNTER — Other Ambulatory Visit: Payer: Self-pay | Admitting: Physician Assistant

## 2022-07-19 DIAGNOSIS — Z79899 Other long term (current) drug therapy: Secondary | ICD-10-CM

## 2022-07-19 DIAGNOSIS — M0579 Rheumatoid arthritis with rheumatoid factor of multiple sites without organ or systems involvement: Secondary | ICD-10-CM

## 2022-07-19 NOTE — Telephone Encounter (Signed)
Next Visit: Due February 2024. Message sent to the front to schedule.   Last Visit: 04/13/2022  Last Fill: 04/13/2022  QM:GNOIBBCWUG arthritis involving multiple sites with positive rheumatoid factor   Current Dose per office note 8/91/6945: Enbrel Sureclick 50 mg sq injections every 14 days.   Labs: 04/13/2022 RBC count, hgb, and hct remain low but have trended down. Hgb is 9.8 Creatinine is borderline elevated. Likely due to chronic anemia. Rest of CMP WNL.   TB Gold: 04/13/2022   Patient advised via my chart she is due to update labs and to schedule follow up visit.   Okay to refill Enbrel?

## 2022-08-02 ENCOUNTER — Other Ambulatory Visit: Payer: Self-pay | Admitting: Rheumatology

## 2022-08-02 ENCOUNTER — Encounter: Payer: Self-pay | Admitting: *Deleted

## 2022-08-02 DIAGNOSIS — Z79899 Other long term (current) drug therapy: Secondary | ICD-10-CM

## 2022-08-02 DIAGNOSIS — M0579 Rheumatoid arthritis with rheumatoid factor of multiple sites without organ or systems involvement: Secondary | ICD-10-CM

## 2022-08-02 NOTE — Telephone Encounter (Signed)
Next Visit: Due February 2024. Message sent to the front to schedule.    Last Visit: 04/13/2022   Last Fill: 07/19/2022 (30 day supply)   [id number] arthritis involving multiple sites with positive rheumatoid factor    Current Dose per office note 4/45/1460: Enbrel Sureclick 50 mg sq injections every 14 days.    Labs: 04/13/2022 RBC count, hgb, and hct remain low but have trended down. Hgb is 9.8 Creatinine is borderline elevated. Likely due to chronic anemia. Rest of CMP WNL.    TB Gold: 04/13/2022    Patient advised via my chart she is due to update labs and to schedule follow up visit.    Okay to refill Enbrel?

## 2022-08-20 LAB — LAB REPORT - SCANNED
A1c: 5.8
EGFR: 88

## 2022-08-20 LAB — HM HEPATITIS C SCREENING LAB: HM Hepatitis Screen: NEGATIVE

## 2022-08-20 LAB — HM HIV SCREENING LAB: HM HIV Screening: NEGATIVE

## 2022-08-26 LAB — HM PAP SMEAR: HPV, high-risk: NEGATIVE

## 2022-09-27 NOTE — Progress Notes (Signed)
Office Visit Note  Patient: Erika King             Date of Birth: October 16, 1980           MRN: [id number]             PCP: Kathyrn Lass, MD Referring: Kathyrn Lass, MD Visit Date: 10/11/2022 Occupation: '@GUAROCC'$ @  Subjective:  Pain in right thumb  History of Present Illness: Erika King is a 42 y.o. female with history of seropositive rheumatoid arthritis.  She states she has been having intermittent discomfort in her right thumb which she describes over the right first PIP joint.  She has not noticed any swelling.  None of the other joints are painful.  She states she takes Enbrel 50 mg subcu every 2 to 4 weeks.  She has not noticed any joint swelling or joint pain.  None of the other joints are painful.  She continues to have some morning stiffness.  She states recently she has been experiencing some abdominal fullness.  She had abdominal ultrasound and the results are pending.    Activities of Daily Living:  Patient reports morning stiffness for 10-15 minutes.   Patient Denies nocturnal pain.  Difficulty dressing/grooming: Denies Difficulty climbing stairs: Denies Difficulty getting out of chair: Denies Difficulty using hands for taps, buttons, cutlery, and/or writing: Denies  Review of Systems  Constitutional:  Negative for fatigue.  HENT:  Negative for mouth sores and mouth dryness.   Eyes:  Negative for dryness.  Respiratory:  Negative for shortness of breath.   Cardiovascular:  Negative for chest pain and palpitations.  Gastrointestinal:  Positive for constipation. Negative for blood in stool and diarrhea.  Endocrine: Negative for increased urination.  Genitourinary:  Negative for involuntary urination.  Musculoskeletal:  Positive for joint pain, joint pain and morning stiffness. Negative for gait problem, joint swelling, myalgias, muscle weakness, muscle tenderness and myalgias.  Skin:  Negative for color change, rash, hair loss and sensitivity to sunlight.   Allergic/Immunologic: Negative for susceptible to infections.  Neurological:  Positive for headaches. Negative for dizziness.  Hematological:  Negative for swollen glands.  Psychiatric/Behavioral:  Positive for sleep disturbance. Negative for depressed mood. The patient is nervous/anxious.     PMFS History:  Patient Active Problem List   Diagnosis Date Noted   Atypical chest pain 06/23/2019   Pure hypercholesterolemia 06/23/2019   High risk medication use 05/30/2018   Rheumatoid arthritis involving multiple sites with positive rheumatoid factor ([facility]) 12/14/2016   Elevated LFTs 12/14/2016   Left carpal tunnel syndrome 12/14/2016   History of asthma 12/14/2016   History of hyperthyroidism 12/14/2016   Vitamin D deficiency 12/14/2016   Fibroid, uterine 07/17/2016    Past Medical History:  Diagnosis Date   Asthma    Atypical chest pain 06/23/2019   Broken arm    Broken foot    Environmental and seasonal allergies    History of radioactive iodine thyroid ablation 09/2015   Hypothyroidism    Indigestion    Pure hypercholesterolemia 06/23/2019   RA (rheumatoid arthritis) ([place])    Sinusitis    Vitamin D deficiency     Family History  Problem Relation Age of Onset   Pneumonia Mother    Huntington's disease Mother    Thyroid disease Mother    Asthma Mother    Cancer Father        lung   Cirrhosis Father    Hypertension Sister    Depression Sister  Huntington's disease Sister    Huntington's disease Brother    Huntington's disease Maternal Grandmother    Healthy Daughter    Breast cancer Maternal Aunt    Past Surgical History:  Procedure Laterality Date   MYOMECTOMY N/A 07/17/2016   Procedure: MYOMECTOMY;  Surgeon: Everett Graff, MD;  Location: [facility];  Service: Gynecology;  Laterality: N/A;  2.5 hours   ROOT CANAL     TOE SURGERY     UMBILICAL HERNIA REPAIR N/A 07/17/2016   Procedure: HERNIA REPAIR UMBILICAL ADULT;  Surgeon: Fanny Skates, MD;  Location: [facility];   Service: General;  Laterality: N/A;  Insertion of mesh   Social History   Social History Narrative   Not on file   Immunization History  Administered Date(s) Administered   Influenza-Unspecified 04/30/2015   PFIZER(Purple Top)SARS-COV-2 Vaccination 08/14/2019, 09/04/2019, 08/17/2020   Tetanus 11/28/2015     Objective: Vital Signs: BP (!) 147/88 (BP Location: Left Arm, Patient Position: Sitting, Cuff Size: Normal)   Pulse 77   Resp 12   Ht '5\' 6"'$  (1.676 m)   Wt 207 lb (93.9 kg)   LMP 10/09/2022   BMI 33.41 kg/m    Physical Exam Vitals and nursing note reviewed.  Constitutional:      Appearance: She is well-developed.  HENT:     Head: Normocephalic and atraumatic.  Eyes:     Conjunctiva/sclera: Conjunctivae normal.  Cardiovascular:     Rate and Rhythm: Normal rate and regular rhythm.     Heart sounds: Normal heart sounds.  Pulmonary:     Effort: Pulmonary effort is normal.     Breath sounds: Normal breath sounds.  Abdominal:     General: Bowel sounds are normal.     Palpations: Abdomen is soft.  Musculoskeletal:     Cervical back: Normal range of motion.  Lymphadenopathy:     Cervical: No cervical adenopathy.  Skin:    General: Skin is warm and dry.     Capillary Refill: Capillary refill takes less than 2 seconds.  Neurological:     Mental Status: She is alert and oriented to person, place, and time.  Psychiatric:        Behavior: Behavior normal.      Musculoskeletal Exam: Cervical, thoracic and lumbar spine were in good range of motion.  Shoulder joints, elbow joints, wrist joints, MCPs PIPs and DIPs been good range of motion.  She had mild tenderness on palpation of her right first PIP joint but no synovitis was noted.  Hip joints and knee joints with good range of motion without any warmth swelling or effusion.  There was no tenderness over ankles or MTPs.  CDAI Exam: CDAI Score: -- Patient Global: 2 mm; Provider Global: 2 mm Swollen: --; Tender:  -- Joint Exam 10/11/2022   No joint exam has been documented for this visit   There is currently no information documented on the homunculus. Go to the Rheumatology activity and complete the homunculus joint exam.  Investigation: No additional findings.  Imaging: No results found.  Recent Labs: Lab Results  Component Value Date   WBC 5.4 04/13/2022   HGB 9.8 (L) 04/13/2022   PLT 262 04/13/2022   NA 138 04/13/2022   K 3.9 04/13/2022   CL 107 04/13/2022   CO2 25 04/13/2022   GLUCOSE 72 04/13/2022   BUN 12 04/13/2022   CREATININE 1.05 (H) 04/13/2022   BILITOT 0.2 04/13/2022   ALKPHOS 30 (L) 07/18/2016   AST 15 04/13/2022  ALT 11 04/13/2022   PROT 6.8 04/13/2022   ALBUMIN 3.1 (L) 07/18/2016   CALCIUM 8.9 04/13/2022   GFRAA 96 03/18/2020   QFTBGOLDPLUS NEGATIVE 04/13/2022    Speciality Comments: No specialty comments available.  Procedures:  No procedures performed Allergies: Black walnut pollen allergy skin test, Cat hair extract, Dog epithelium, Molds & smuts, and Other   Assessment / Plan:     Visit Diagnoses: Rheumatoid arthritis involving multiple sites with positive rheumatoid factor (HCC) - +RF, +CCP Ab: Patient states that she has been taking Enbrel 50 mg subcu every 2 to 4 weeks.  She is not having any breakthrough pain.  She has not noticed any joint swelling.  She has been tolerating it well without any side effects.  She has been experiencing some discomfort in her right thumb which she describes over the PIP joint.  No synovitis was noted.  We discussed that the thumb discomfort could be related to texting or doing routine activities.  I also offered repeating x-rays of her hands and feet today.  Patient stated that she will get x-rays at the next visit as she has been seen every 2 weeks.  We will get x-rays of bilateral hands and bilateral feet at the follow-up visit.  High risk medication use - Enbrel Sureclick 50 mg sq injections every 14 days.  TB Gold  negative on April 13, 2022 -I do not have any labs in the system since September.  Patient states she had labs with her GYN.  I do not have those labs available.  Will get CBC and CMP today.  She was advised to get labs within 3 months to monitor for drug toxicity.  Plan: CBC with Differential/Platelet, COMPLETE METABOLIC PANEL WITH GFR.  Information for immunization was placed in the AVS.  She was advised to hold Enbrel if she develops an infection resume after the infection resolves.  Annual skin examination was advised to screen for skin cancer while she is on Enbrel.  Abdominal discomfort-patient states she has been having right upper quadrant fullness after she had meals.  She had ultrasound of her abdomen and the results are pending.  Vitamin D deficiency - Vitamin D 31 in 2019.  She has been taking vitamin D.  History of hyperthyroidism  History of asthma-she uses albuterol on as needed basis.    Orders: Orders Placed This Encounter  Procedures   CBC with Differential/Platelet   COMPLETE METABOLIC PANEL WITH GFR   No orders of the defined types were placed in this encounter.    Follow-Up Instructions: Return in about 5 months (around 03/13/2023) for Rheumatoid arthritis.   Bo Merino, MD  Note - This record has been created using Editor, commissioning.  Chart creation errors have been sought, but may not always  have been located. Such creation errors do not reflect on  the standard of medical care.

## 2022-09-28 ENCOUNTER — Other Ambulatory Visit (HOSPITAL_BASED_OUTPATIENT_CLINIC_OR_DEPARTMENT_OTHER): Payer: Self-pay | Admitting: Internal Medicine

## 2022-09-28 ENCOUNTER — Other Ambulatory Visit: Payer: Self-pay | Admitting: Internal Medicine

## 2022-09-28 DIAGNOSIS — R1011 Right upper quadrant pain: Secondary | ICD-10-CM

## 2022-09-29 ENCOUNTER — Ambulatory Visit (HOSPITAL_BASED_OUTPATIENT_CLINIC_OR_DEPARTMENT_OTHER): Payer: BC Managed Care – PPO

## 2022-10-11 ENCOUNTER — Encounter: Payer: Self-pay | Admitting: Rheumatology

## 2022-10-11 ENCOUNTER — Encounter: Payer: Self-pay | Admitting: *Deleted

## 2022-10-11 ENCOUNTER — Ambulatory Visit: Payer: BC Managed Care – PPO | Attending: Rheumatology | Admitting: Rheumatology

## 2022-10-11 VITALS — BP 133/83 | HR 84 | Resp 12 | Ht 66.0 in | Wt 207.0 lb

## 2022-10-11 DIAGNOSIS — M0579 Rheumatoid arthritis with rheumatoid factor of multiple sites without organ or systems involvement: Secondary | ICD-10-CM | POA: Diagnosis not present

## 2022-10-11 DIAGNOSIS — Z8709 Personal history of other diseases of the respiratory system: Secondary | ICD-10-CM

## 2022-10-11 DIAGNOSIS — R350 Frequency of micturition: Secondary | ICD-10-CM

## 2022-10-11 DIAGNOSIS — E559 Vitamin D deficiency, unspecified: Secondary | ICD-10-CM | POA: Diagnosis not present

## 2022-10-11 DIAGNOSIS — R109 Unspecified abdominal pain: Secondary | ICD-10-CM

## 2022-10-11 DIAGNOSIS — Z79899 Other long term (current) drug therapy: Secondary | ICD-10-CM

## 2022-10-11 DIAGNOSIS — Z8639 Personal history of other endocrine, nutritional and metabolic disease: Secondary | ICD-10-CM

## 2022-10-11 NOTE — Patient Instructions (Addendum)
Standing Labs We placed an order today for your standing lab work.   Please have your standing labs drawn in June and every 3 months  Please have your labs drawn 2 weeks prior to your appointment so that the provider can discuss your lab results at your appointment, if possible.  Please note that you may see your imaging and lab results in [facility] before we have reviewed them. We will contact you once all results are reviewed. Please allow our office up to 72 hours to thoroughly review all of the results before contacting the office for clarification of your results.  WALK-IN LAB HOURS  Monday through Thursday from 8:00 am -12:30 pm and 1:00 pm-5:00 pm and Friday from 8:00 am-12:00 pm.  Patients with office visits requiring labs will be seen before walk-in labs.  You may encounter longer than normal wait times. Please allow additional time. Wait times may be shorter on  Monday and Thursday afternoons.  We do not book appointments for walk-in labs. We appreciate your patience and understanding with our staff.   Labs are drawn by [facility]. Please bring your co-pay at the time of your lab draw.  You may receive a bill from [facility] for your lab work.  Please note if you are on Hydroxychloroquine and and an order has been placed for a Hydroxychloroquine level,  you will need to have it drawn 4 hours or more after your last dose.  If you wish to have your labs drawn at another location, please call the office 24 hours in advance so we can fax the orders.  The office is located at [address], [place], [place], [place] [address]   If you have any questions regarding directions or hours of operation,  please call [phone].   As a reminder, please drink plenty of water prior to coming for your lab work. Thanks!   Vaccines You are taking a medication(s) that can suppress your immune system.  The following immunizations are recommended: Flu annually Covid-19  RSV Td/Tdap (tetanus,  diphtheria, pertussis) every 10 years Pneumonia (Prevnar 15 then Pneumovax 23 at least 1 year apart.  Alternatively, can take Prevnar 20 without needing additional dose) Shingrix: 2 doses from 4 weeks to 6 months apart  Please check with your PCP to make sure you are up to date.  If you have signs or symptoms of an infection or start antibiotics: First, call your PCP for workup of your infection. Hold your medication through the infection, until you complete your antibiotics, and until symptoms resolve if you take the following: Injectable medication (Actemra, Benlysta, Cimzia, Cosentyx, Enbrel, Humira, Kevzara, Orencia, Remicade, Simponi, Stelara, Taltz, Tremfya) Methotrexate Leflunomide (Arava) Mycophenolate (Cellcept) Morrie Sheldon, Olumiant, or Rinvoq  Please get an annual skin exam to screen for skin cancer while you are on Enbrel

## 2022-10-12 LAB — COMPLETE METABOLIC PANEL WITH GFR
AG Ratio: 1.2 (calc) (ref 1.0–2.5)
ALT: 11 U/L (ref 6–29)
AST: 14 U/L (ref 10–30)
Albumin: 3.9 g/dL (ref 3.6–5.1)
Alkaline phosphatase (APISO): 40 U/L (ref 31–125)
BUN: 9 mg/dL (ref 7–25)
CO2: 24 mmol/L (ref 20–32)
Calcium: 9.3 mg/dL (ref 8.6–10.2)
Chloride: 105 mmol/L (ref 98–110)
Creat: 0.88 mg/dL (ref 0.50–0.99)
Globulin: 3.2 g/dL (calc) (ref 1.9–3.7)
Glucose, Bld: 110 mg/dL — ABNORMAL HIGH (ref 65–99)
Potassium: 3.7 mmol/L (ref 3.5–5.3)
Sodium: 136 mmol/L (ref 135–146)
Total Bilirubin: 0.3 mg/dL (ref 0.2–1.2)
Total Protein: 7.1 g/dL (ref 6.1–8.1)
eGFR: 85 mL/min/{1.73_m2} (ref >=60)

## 2022-10-12 LAB — CBC WITH DIFFERENTIAL/PLATELET
Absolute Monocytes: 425 cells/uL (ref 200–950)
Basophils Absolute: 59 cells/uL (ref 0–200)
Basophils Relative: 1 %
Eosinophils Absolute: 566 cells/uL — ABNORMAL HIGH (ref 15–500)
Eosinophils Relative: 9.6 %
HCT: 32 % — ABNORMAL LOW (ref 35.0–45.0)
Hemoglobin: 10.2 g/dL — ABNORMAL LOW (ref 11.7–15.5)
Lymphs Abs: 2354 cells/uL (ref 850–3900)
MCH: 28.9 pg (ref 27.0–33.0)
MCHC: 31.9 g/dL — ABNORMAL LOW (ref 32.0–36.0)
MCV: 90.7 fL (ref 80.0–100.0)
MPV: 10.1 fL (ref 7.5–12.5)
Monocytes Relative: 7.2 %
Neutro Abs: 2496 cells/uL (ref 1500–7800)
Neutrophils Relative %: 42.3 %
Platelets: 280 10*3/uL (ref 140–400)
RBC: 3.53 10*6/uL — ABNORMAL LOW (ref 3.80–5.10)
RDW: 14.9 % (ref 11.0–15.0)
Total Lymphocyte: 39.9 %
WBC: 5.9 10*3/uL (ref 3.8–10.8)

## 2022-10-12 NOTE — Progress Notes (Signed)
Hemoglobin is low and stable.  Glucose is elevated, probably not a fasting sample.

## 2022-10-30 ENCOUNTER — Telehealth: Payer: Self-pay | Admitting: Pharmacist

## 2022-10-30 NOTE — Telephone Encounter (Signed)
Submitted a Prior Authorization renewal request to  [place]  for ENBREL via CoverMyMeds. Will update once we receive a response.  Case # [id number] Phone: [phone] Fax: [phone]  Knox Saliva, PharmD, MPH, BCPS, CPP Clinical Pharmacist (Rheumatology and Pulmonology)

## 2022-10-31 NOTE — Telephone Encounter (Signed)
Received notification from [facility] regarding a prior authorization for ENBREL. Authorization has been APPROVED from 10/30/22 to 10/30/23. Approval letter sent to scan center.  Patient can continue to fill through [facility]: [phone]  Authorization # [id number]  Knox Saliva, PharmD, MPH, BCPS, CPP Clinical Pharmacist (Rheumatology and Pulmonology)

## 2022-11-14 ENCOUNTER — Other Ambulatory Visit: Payer: Self-pay | Admitting: Obstetrics and Gynecology

## 2022-11-14 DIAGNOSIS — Z1231 Encounter for screening mammogram for malignant neoplasm of breast: Secondary | ICD-10-CM

## 2022-12-20 ENCOUNTER — Ambulatory Visit
Admission: RE | Admit: 2022-12-20 | Discharge: 2022-12-20 | Disposition: A | Discharge status: Home / Self Care | Payer: BC Managed Care – PPO | Source: Ambulatory Visit | Attending: Obstetrics and Gynecology | Admitting: Obstetrics and Gynecology

## 2022-12-20 DIAGNOSIS — Z1231 Encounter for screening mammogram for malignant neoplasm of breast: Secondary | ICD-10-CM

## 2022-12-26 ENCOUNTER — Other Ambulatory Visit: Payer: Self-pay | Admitting: Obstetrics and Gynecology

## 2022-12-26 DIAGNOSIS — R928 Other abnormal and inconclusive findings on diagnostic imaging of breast: Secondary | ICD-10-CM

## 2023-01-10 ENCOUNTER — Ambulatory Visit
Admission: RE | Admit: 2023-01-10 | Discharge: 2023-01-10 | Disposition: A | Discharge status: Home / Self Care | Payer: BC Managed Care – PPO | Source: Ambulatory Visit | Attending: Obstetrics and Gynecology | Admitting: Obstetrics and Gynecology

## 2023-01-10 DIAGNOSIS — R928 Other abnormal and inconclusive findings on diagnostic imaging of breast: Secondary | ICD-10-CM

## 2023-02-01 ENCOUNTER — Telehealth: Payer: Self-pay

## 2023-02-01 NOTE — Telephone Encounter (Signed)
Patient contacted the office and states she would like to make an appointment to be seen because she has noticed some enlargement around her left lymph node region. Advised the patient they want to see her around 03/13/2023. Patient states she would like to be seen sooner than that. Patient call back number is [phone].

## 2023-02-04 NOTE — Telephone Encounter (Signed)
Please advise 

## 2023-02-04 NOTE — Telephone Encounter (Signed)
Please advise patient to see evaluation by PCP to rule out infection

## 2023-02-04 NOTE — Telephone Encounter (Signed)
I called patient, patient verbalized understanding. 

## 2023-02-18 ENCOUNTER — Other Ambulatory Visit: Payer: Self-pay | Admitting: Obstetrics and Gynecology

## 2023-02-18 DIAGNOSIS — N6489 Other specified disorders of breast: Secondary | ICD-10-CM

## 2023-05-03 ENCOUNTER — Other Ambulatory Visit (HOSPITAL_COMMUNITY): Payer: Self-pay | Admitting: Family Medicine

## 2023-05-03 DIAGNOSIS — R590 Localized enlarged lymph nodes: Secondary | ICD-10-CM

## 2023-05-08 ENCOUNTER — Encounter: Payer: Self-pay | Admitting: Family Medicine

## 2023-05-09 ENCOUNTER — Telehealth: Payer: Self-pay | Admitting: Oncology

## 2023-05-09 NOTE — Telephone Encounter (Signed)
Called and left VM for patient to call back to schedule an appointment.

## 2023-05-14 ENCOUNTER — Telehealth: Payer: Self-pay | Admitting: Oncology

## 2023-05-14 NOTE — Telephone Encounter (Signed)
Scheduled appointments per referral. Patient is aware of the appointment time and date as well as the address. Patient was informed to arrive 10-15 minutes prior with updated insurance information. All questions were answered.

## 2023-06-07 ENCOUNTER — Other Ambulatory Visit: Payer: Self-pay | Admitting: Oncology

## 2023-06-07 ENCOUNTER — Inpatient Hospital Stay: Payer: BC Managed Care – PPO | Attending: Oncology | Admitting: Oncology

## 2023-06-07 ENCOUNTER — Encounter: Payer: Self-pay | Admitting: Oncology

## 2023-06-07 ENCOUNTER — Inpatient Hospital Stay: Payer: BC Managed Care – PPO

## 2023-06-07 VITALS — BP 119/84 | HR 94 | Temp 98.1°F | Resp 18 | Ht 65.5 in | Wt 208.5 lb

## 2023-06-07 DIAGNOSIS — N920 Excessive and frequent menstruation with regular cycle: Secondary | ICD-10-CM | POA: Insufficient documentation

## 2023-06-07 DIAGNOSIS — D500 Iron deficiency anemia secondary to blood loss (chronic): Secondary | ICD-10-CM | POA: Insufficient documentation

## 2023-06-07 DIAGNOSIS — M0579 Rheumatoid arthritis with rheumatoid factor of multiple sites without organ or systems involvement: Secondary | ICD-10-CM | POA: Insufficient documentation

## 2023-06-07 DIAGNOSIS — D509 Iron deficiency anemia, unspecified: Secondary | ICD-10-CM

## 2023-06-07 DIAGNOSIS — R221 Localized swelling, mass and lump, neck: Secondary | ICD-10-CM | POA: Insufficient documentation

## 2023-06-07 LAB — IRON AND IRON BINDING CAPACITY (CC-WL,HP ONLY)
Iron: 39 ug/dL (ref 28–170)
Saturation Ratios: 8 % — ABNORMAL LOW (ref 10.4–31.8)
TIBC: 462 ug/dL — ABNORMAL HIGH (ref 250–450)
UIBC: 423 ug/dL (ref 148–442)

## 2023-06-07 LAB — CBC WITH DIFFERENTIAL/PLATELET
Abs Immature Granulocytes: 0.01 10*3/uL (ref 0.00–0.07)
Basophils Absolute: 0 10*3/uL (ref 0.0–0.1)
Basophils Relative: 1 %
Eosinophils Absolute: 0.4 10*3/uL (ref 0.0–0.5)
Eosinophils Relative: 7 %
HCT: 31.2 % — ABNORMAL LOW (ref 36.0–46.0)
Hemoglobin: 10 g/dL — ABNORMAL LOW (ref 12.0–15.0)
Immature Granulocytes: 0 %
Lymphocytes Relative: 43 %
Lymphs Abs: 2.6 10*3/uL (ref 0.7–4.0)
MCH: 27.4 pg (ref 26.0–34.0)
MCHC: 32.1 g/dL (ref 30.0–36.0)
MCV: 85.5 fL (ref 80.0–100.0)
Monocytes Absolute: 0.5 10*3/uL (ref 0.1–1.0)
Monocytes Relative: 9 %
Neutro Abs: 2.3 10*3/uL (ref 1.7–7.7)
Neutrophils Relative %: 40 %
Platelets: 357 10*3/uL (ref 150–400)
RBC: 3.65 MIL/uL — ABNORMAL LOW (ref 3.87–5.11)
RDW: 19 % — ABNORMAL HIGH (ref 11.5–15.5)
WBC: 5.9 10*3/uL (ref 4.0–10.5)
nRBC: 0 % (ref 0.0–0.2)

## 2023-06-07 LAB — VITAMIN B12: Vitamin B-12: 424 pg/mL (ref 180–914)

## 2023-06-07 LAB — COMPREHENSIVE METABOLIC PANEL
ALT: 17 U/L (ref 0–44)
AST: 19 U/L (ref 15–41)
Albumin: 4.1 g/dL (ref 3.5–5.0)
Alkaline Phosphatase: 40 U/L (ref 38–126)
Anion gap: 4 — ABNORMAL LOW (ref 5–15)
BUN: 9 mg/dL (ref 6–20)
CO2: 26 mmol/L (ref 22–32)
Calcium: 9.7 mg/dL (ref 8.9–10.3)
Chloride: 106 mmol/L (ref 98–111)
Creatinine, Ser: 0.88 mg/dL (ref 0.44–1.00)
GFR, Estimated: >60 mL/min (ref >60)
Glucose, Bld: 96 mg/dL (ref 70–99)
Potassium: 3.9 mmol/L (ref 3.5–5.1)
Sodium: 136 mmol/L (ref 135–145)
Total Bilirubin: 0.4 mg/dL (ref <1.2)
Total Protein: 7.9 g/dL (ref 6.5–8.1)

## 2023-06-07 LAB — FERRITIN: Ferritin: 31 ng/mL (ref 11–307)

## 2023-06-07 LAB — FOLATE: Folate: 11.8 ng/mL (ref >5.9)

## 2023-06-07 LAB — LACTATE DEHYDROGENASE: LDH: 242 U/L — ABNORMAL HIGH (ref 98–192)

## 2023-06-07 NOTE — Assessment & Plan Note (Signed)
Heavy menstrual bleeding with large clots, likely secondary to recurrent fibroids. Cycles occur monthly, lasting 6-7 days with heaviest bleeding on days 2-4. -Continue follow-up with gynecologist, Dr. Su Hilt.

## 2023-06-07 NOTE — Assessment & Plan Note (Signed)
Noticed swelling in the neck over the past two weeks. Ultrasound performed in October showed small benign appearing lymph node. No associated pain or difficulty swallowing. -No further action required at this time. Monitor for changes in size or associated symptoms.

## 2023-06-07 NOTE — Progress Notes (Signed)
[facility]  HEMATOLOGY CLINIC CONSULTATION NOTE    PATIENT NAME: Erika King   MR#: [id number] DOB: [birth year]  DATE OF SERVICE: 06/07/2023  Patient Care Team: Orpha Bur, MD as PCP - General (Family Medicine)  REASON FOR CONSULTATION/ CHIEF COMPLAINT:  Evaluation of anemia.  HISTORY OF PRESENT ILLNESS:  Erika King is a 42 y.o. 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    Discussed the use of AI scribe software for clinical note transcription with the patient, who gave verbal consent to proceed.   She reports history of fibroids and heavy menstrual periods. She has been taking iron supplements inconsistently due to constipation. The patient believes her anemia is due to her heavy menstrual periods, which often include large clots. She has had a myomectomy in the past and is considering further treatment options for her fibroids. The patient also reports a swelling in her neck, which has been evaluated with an ultrasound and deemed benign. She is currently taking Enbrel for rheumatoid arthritis.  On 04/09/2023, labs PCPs office showed hemoglobin of 9.4, hematocrit 30.8, MCV 83.4.  White count and platelet count are within normal limits.  Iron studies on 05/03/2023 showed iron deficiency with ferritin of 7, iron saturation decreased at 6%.  Hence she was referred to [place] for consideration of IV iron.  She denies recent chest pain on exertion, shortness of breath on minimal exertion, pre-syncopal episodes, or palpitations.  She had not noticed any recent bleeding such as epistaxis, hematuria or hematochezia  She had no prior history or diagnosis of cancer. Her age appropriate screening programs are up-to-date.   MEDICAL HISTORY:  Past Medical History:  Diagnosis Date   Asthma    Atypical chest pain 06/23/2019    Broken arm    Broken foot    Environmental and seasonal allergies    History of radioactive iodine thyroid ablation 09/2015   Hypothyroidism    Indigestion    Pure hypercholesterolemia 06/23/2019   RA (rheumatoid arthritis) (HCC)    Sinusitis    Vitamin D deficiency     SURGICAL HISTORY: Past Surgical History:  Procedure Laterality Date   MYOMECTOMY N/A 07/17/2016   Procedure: MYOMECTOMY;  Surgeon: Osborn Coho, MD;  Location: [facility];  Service: Gynecology;  Laterality: N/A;  2.5 hours   ROOT CANAL     TOE SURGERY     UMBILICAL HERNIA REPAIR N/A 07/17/2016   Procedure: HERNIA REPAIR UMBILICAL ADULT;  Surgeon: Claud Kelp, MD;  Location: [facility];  Service: General;  Laterality: N/A;  Insertion of mesh    SOCIAL HISTORY: She reports that she has never smoked. She has been exposed to tobacco smoke. She has never used smokeless tobacco. She reports current alcohol use. She reports that she does not use drugs. Social History   Socioeconomic History   Marital status: Single    Spouse name: Not on file   Number of children: Not on file   Years of education: Not on file   Highest education level: Not on file  Occupational History   Not on file  Tobacco Use   Smoking status: Never    Passive exposure: Past   Smokeless tobacco: Never  Vaping Use   Vaping status: Never Used  Substance and Sexual Activity   Alcohol use: Yes    Comment: rare   Drug use: No  Sexual activity: Not on file  Other Topics Concern   Not on file  Social History Narrative   Not on file   Social Determinants of Health   Financial Resource Strain: Not on file  Food Insecurity: No Food Insecurity (06/07/2023)   Hunger Vital Sign    Worried About Running Out of Food in the Last Year: Never true    Ran Out of Food in the Last Year: Never true  Transportation Needs: No Transportation Needs (06/07/2023)   PRAPARE - Administrator, Civil Service (Medical): No    Lack of Transportation  (Non-Medical): No  Physical Activity: Not on file  Stress: Not on file  Social Connections: Unknown (10/03/2022)   Received from [facility], [facility]   Social Network    Social Network: Not on file  Intimate Partner Violence: Not At Risk (06/07/2023)   Humiliation, Afraid, Rape, and Kick questionnaire    Fear of Current or Ex-Partner: No    Emotionally Abused: No    Physically Abused: No    Sexually Abused: No    FAMILY HISTORY: Family History  Problem Relation Age of Onset   Pneumonia Mother    Huntington's disease Mother    Thyroid disease Mother    Asthma Mother    Cancer Father        lung   Cirrhosis Father    Hypertension Sister    Depression Sister    Huntington's disease Sister    Huntington's disease Brother    Huntington's disease Maternal Grandmother    Healthy Daughter    Breast cancer Maternal Aunt     ALLERGIES:  She is allergic to black walnut pollen allergy skin test, cat hair extract, dog epithelium, molds & smuts, and other.  MEDICATIONS:  Current Outpatient Medications  Medication Sig Dispense Refill   Butalbital-APAP-Caffeine 50-325-40 MG capsule 1 capsule as needed Orally every 4 hrs     Cholecalciferol 50 MCG (2000 UT) CAPS 2 capsules Orally once a month     ELDERBERRY PO Orally     etanercept (ENBREL SURECLICK) 50 MG/ML injection Inject 50 mg into the skin every 14 (fourteen) days. 4 mL 0   levothyroxine (SYNTHROID) 175 MCG tablet Take 175 mcg by mouth every morning.     Multiple Vitamin (MULTIVITAMIN) capsule Take 1 capsule by mouth daily.     Nutritional Supplements (CHRONO-IMMUNE SHIELD PO) Take 1 tablet by mouth as needed (when feeling under the weather).      UNABLE TO FIND Take 250 mg by mouth daily. Med Name: Magnesium     albuterol (PROVENTIL HFA;VENTOLIN HFA) 108 (90 Base) MCG/ACT inhaler Inhale 1-2 puffs into the lungs every 6 (six) hours as needed for wheezing or shortness of breath. (Patient not taking: Reported on 06/07/2023)      Calcium-Magnesium-Vitamin D (CALCIUM 500 PO) Take 1 tablet by mouth every morning. (Patient not taking: Reported on 06/07/2023)     cetirizine (ZYRTEC) 10 MG tablet Take 5 mg by mouth as needed for allergies. (Patient not taking: Reported on 06/07/2023)     famotidine (PEPCID) 20 MG tablet 1 tablet at bedtime as needed Orally Once a day for 30 day(s) (Patient not taking: Reported on 06/07/2023)     ferrous sulfate 325 (65 FE) MG tablet Take 325 mg by mouth daily with breakfast. (Patient not taking: Reported on 06/07/2023)     fluticasone (FLONASE) 50 MCG/ACT nasal spray Place 1 spray into both nostrils daily as needed for allergies or rhinitis. (  Patient not taking: Reported on 06/07/2023)     montelukast (SINGULAIR) 10 MG tablet take 1 tablet by mouth daily as needed for allergies (Patient not taking: Reported on 06/07/2023)  2   TURMERIC PO Take by mouth 3 (three) times a week. (Patient not taking: Reported on 06/07/2023)     No current facility-administered medications for this visit.    REVIEW OF SYSTEMS:    Review of Systems - Oncology  All other pertinent systems were reviewed and were negative except as mentioned above.  PHYSICAL EXAMINATION:  ECOG PERFORMANCE STATUS: 1 - Symptomatic but completely ambulatory  Vitals:   06/07/23 1356  BP: 119/84  Pulse: 94  Resp: 18  Temp: 98.1 F (36.7 C)  SpO2: 98%   Filed Weights   06/07/23 1356  Weight: 208 lb 8 oz (94.6 kg)    Physical Exam Constitutional:      General: She is not in acute distress.    Appearance: Normal appearance.  HENT:     Head: Normocephalic and atraumatic.  Eyes:     General: No scleral icterus.    Conjunctiva/sclera: Conjunctivae normal.  Cardiovascular:     Rate and Rhythm: Normal rate and regular rhythm.     Heart sounds: Normal heart sounds.  Pulmonary:     Effort: Pulmonary effort is normal.     Breath sounds: Normal breath sounds.  Abdominal:     General: There is no distension.  Musculoskeletal:      Right lower leg: No edema.     Left lower leg: No edema.  Lymphadenopathy:     Cervical: No cervical adenopathy.  Neurological:     General: No focal deficit present.     Mental Status: She is alert and oriented to person, place, and time.  Psychiatric:        Mood and Affect: Mood normal.        Behavior: Behavior normal.        Thought Content: Thought content normal.      LABORATORY DATA:   I have reviewed the data as listed.  Results for orders placed or performed in visit on 06/07/23  Lactate dehydrogenase  Result Value Ref Range   LDH 242 (H) 98 - 192 U/L  Iron and Iron Binding Capacity ([facility] only)  Result Value Ref Range   Iron 39 28 - 170 ug/dL   TIBC 161 (H) 096 - 045 ug/dL   Saturation Ratios 8 (L) 10.4 - 31.8 %   UIBC 423 148 - 442 ug/dL  CBC with Differential/Platelet  Result Value Ref Range   WBC 5.9 4.0 - 10.5 K/uL   RBC 3.65 (L) 3.87 - 5.11 MIL/uL   Hemoglobin 10.0 (L) 12.0 - 15.0 g/dL   HCT 40.9 (L) 81.1 - 91.4 %   MCV 85.5 80.0 - 100.0 fL   MCH 27.4 26.0 - 34.0 pg   MCHC 32.1 30.0 - 36.0 g/dL   RDW 78.2 (H) 95.6 - 21.3 %   Platelets 357 150 - 400 K/uL   nRBC 0.0 0.0 - 0.2 %   Neutrophils Relative % 40 %   Neutro Abs 2.3 1.7 - 7.7 K/uL   Lymphocytes Relative 43 %   Lymphs Abs 2.6 0.7 - 4.0 K/uL   Monocytes Relative 9 %   Monocytes Absolute 0.5 0.1 - 1.0 K/uL   Eosinophils Relative 7 %   Eosinophils Absolute 0.4 0.0 - 0.5 K/uL   Basophils Relative 1 %   Basophils Absolute 0.0 0.0 -  0.1 K/uL   Immature Granulocytes 0 %   Abs Immature Granulocytes 0.01 0.00 - 0.07 K/uL  Comprehensive metabolic panel  Result Value Ref Range   Sodium 136 135 - 145 mmol/L   Potassium 3.9 3.5 - 5.1 mmol/L   Chloride 106 98 - 111 mmol/L   CO2 26 22 - 32 mmol/L   Glucose, Bld 96 70 - 99 mg/dL   BUN 9 6 - 20 mg/dL   Creatinine, Ser 2.95 0.44 - 1.00 mg/dL   Calcium 9.7 8.9 - 62.1 mg/dL   Total Protein 7.9 6.5 - 8.1 g/dL   Albumin 4.1 3.5 - 5.0 g/dL   AST 19  15 - 41 U/L   ALT 17 0 - 44 U/L   Alkaline Phosphatase 40 38 - 126 U/L   Total Bilirubin 0.4 <1.2 mg/dL   GFR, Estimated >30 >86 mL/min   Anion gap 4 (L) 5 - 15     RADIOGRAPHIC STUDIES:  I have personally reviewed the radiological images as listed and agree with the findings in the report.  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    Iron deficiency anemia due to chronic blood loss - History of heavy menstrual bleeding with large clots, likely secondary to recurrent uterine fibroids. Previous inconsistent oral iron supplementation due to constipation. Iron studies from October 4th showed decreased iron and ferritin levels. -Labs today showed persistent anemia with hemoglobin of 10, hematocrit 31.2, MCV 85.5.  White count, platelet count are within normal limits.  Iron studies continue to show evidence of iron deficiency with decreased iron saturation of 8%.  Ferritin pending.  LDH slightly elevated at 242, likely from ineffective erythropoiesis. -Plan for IV iron infusion, pending insurance approval.  Request submitted for Feraheme x 2 doses, to be given once a week.  IV iron infusions will be arranged at the infusion clinic at [facility]. -Will also check B12 and folic acid today to look for any other possible sources of anemia. -Plan to recheck labs and follow-up in 3 months.  Menorrhagia Heavy menstrual bleeding with large clots, likely secondary to recurrent fibroids. Cycles occur monthly, lasting 6-7 days with heaviest bleeding on days 2-4. -Continue follow-up with gynecologist, Dr. Su Hilt.  Rheumatoid arthritis involving multiple sites with positive rheumatoid factor (HCC) Currently well-controlled, possibly in remission. Taking Enbrel once a  month. -Continue current regimen and follow-up with rheumatology.  Neck swelling Noticed swelling in the neck over the past two weeks. Ultrasound performed in October showed small benign appearing lymph node. No associated pain or difficulty swallowing. -No further action required at this time. Monitor for changes in size or associated symptoms.  General Health Maintenance -Consider colonoscopy screening at age 18. -Continue regular mammogram.  Because of recent abnormal mammogram, needs repeat diagnostic mammogram in December 2024.  Patient aware.   Since the cause of anemia seems to be obvious from iron deficiency, I am not pursuing extensive workup at this time.  If inadequate response to IV iron is noted, we will pursue workup to rule out other etiologies.  Orders Placed This Encounter  Procedures   Comprehensive metabolic panel    Standing Status:   Future    Number of Occurrences:   1    Standing Expiration Date:   06/06/2024  CBC with Differential/Platelet    Standing Status:   Future    Number of Occurrences:   1    Standing Expiration Date:   06/06/2024   Iron and Iron Binding Capacity ([facility] only)    Standing Status:   Future    Number of Occurrences:   1    Standing Expiration Date:   06/06/2024   Ferritin    Standing Status:   Future    Number of Occurrences:   1    Standing Expiration Date:   06/06/2024   Vitamin B12    Standing Status:   Future    Number of Occurrences:   1    Standing Expiration Date:   06/06/2024   Folate    Standing Status:   Future    Number of Occurrences:   1    Standing Expiration Date:   06/06/2024   Lactate dehydrogenase    Standing Status:   Future    Number of Occurrences:   1    Standing Expiration Date:   06/06/2024    The total time spent in the appointment was 45 minutes encounter with patients including review of chart and various tests results, discussions about plan of care and coordination of care plan.  I reviewed lab  results and outside records for this visit and discussed relevant results with the patient. Diagnosis, plan of care and treatment options were also discussed in detail with the patient. Opportunity provided to ask questions and answers provided to her apparent satisfaction. Provided instructions to call our clinic with any problems, questions or concerns prior to return visit. I recommended to continue follow-up with PCP and sub-specialists. She verbalized understanding and agreed with the plan. No barriers to learning was detected.   Future Appointments  Date Time Provider Department Center  09/06/2023  3:00 PM [facility] None  09/06/2023  3:20 PM Spyros Winch, Archie Patten, MD [facility] None     Meryl Crutch, MD  [facility] [address] [place] [address] Dept: [phone] Dept Fax: [phone]  06/07/2023 4:13 PM   This document was completed utilizing speech recognition software. Grammatical errors, random word insertions, pronoun errors, and incomplete sentences are an occasional consequence of this system due to software limitations, ambient noise, and hardware issues. Any formal questions or concerns about the content, text or information contained within the body of this dictation should be directly addressed to the provider for clarification.

## 2023-06-07 NOTE — Assessment & Plan Note (Signed)
Currently well-controlled, possibly in remission. Taking Enbrel once a month. -Continue current regimen and follow-up with rheumatology.

## 2023-06-07 NOTE — Assessment & Plan Note (Signed)
-   History of heavy menstrual bleeding with large clots, likely secondary to recurrent uterine fibroids. Previous inconsistent oral iron supplementation due to constipation. Iron studies from October 4th showed decreased iron and ferritin levels. -Labs today showed persistent anemia with hemoglobin of 10, hematocrit 31.2, MCV 85.5.  White count, platelet count are within normal limits.  Iron studies continue to show evidence of iron deficiency with decreased iron saturation of 8%.  Ferritin pending.  LDH slightly elevated at 242, likely from ineffective erythropoiesis. -Plan for IV iron infusion, pending insurance approval.  Request submitted for Feraheme x 2 doses, to be given once a week.  IV iron infusions will be arranged at the infusion clinic at [facility]. -Will also check B12 and folic acid today to look for any other possible sources of anemia. -Plan to recheck labs and follow-up in 3 months.

## 2023-06-10 ENCOUNTER — Telehealth: Payer: Self-pay

## 2023-06-10 ENCOUNTER — Telehealth: Payer: Self-pay | Admitting: Family Medicine

## 2023-06-10 NOTE — Telephone Encounter (Signed)
checking to see if you received the info. says you can leave a voicemail.

## 2023-06-10 NOTE — Telephone Encounter (Signed)
Dr. Arlana Pouch, patient will be scheduled as soon as possible.  Auth Submission: NO AUTH NEEDED Site of care: Site of care: [facility] Payer: BCBS Medication & CPT/J Code(s) submitted: Feraheme (ferumoxytol) [id number] Route of submission (phone, fax, portal):  Phone # Fax # Auth type: Buy/Bill PB Units/visits requested: 510mg  x 2 doses Reference number:  Approval from: 06/10/23 to 07/30/23

## 2023-07-29 ENCOUNTER — Encounter: Payer: Self-pay | Admitting: Oncology

## 2023-08-15 ENCOUNTER — Encounter: Payer: Self-pay | Admitting: Oncology

## 2023-08-15 ENCOUNTER — Ambulatory Visit
Admission: RE | Admit: 2023-08-15 | Discharge: 2023-08-15 | Disposition: A | Discharge status: Home / Self Care | Payer: 59 | Source: Ambulatory Visit | Attending: Obstetrics and Gynecology | Admitting: Obstetrics and Gynecology

## 2023-08-15 ENCOUNTER — Ambulatory Visit: Payer: Self-pay

## 2023-08-15 DIAGNOSIS — N6489 Other specified disorders of breast: Secondary | ICD-10-CM

## 2023-08-16 ENCOUNTER — Other Ambulatory Visit: Payer: Self-pay | Admitting: Obstetrics and Gynecology

## 2023-08-16 DIAGNOSIS — N6489 Other specified disorders of breast: Secondary | ICD-10-CM

## 2023-09-06 ENCOUNTER — Telehealth: Payer: Self-pay | Admitting: Oncology

## 2023-09-06 ENCOUNTER — Inpatient Hospital Stay: Payer: Self-pay | Admitting: Oncology

## 2023-09-06 ENCOUNTER — Inpatient Hospital Stay: Payer: Self-pay

## 2023-09-06 NOTE — Telephone Encounter (Signed)
 Received 2/6 staff message to cancel 2/7 patient appointments. Called the patient to confirm wanting to cancel instead of reschedule. Patient stated she was going to change providers and to cancel her appointments.

## 2023-10-02 ENCOUNTER — Encounter (INDEPENDENT_AMBULATORY_CARE_PROVIDER_SITE_OTHER): Payer: Self-pay

## 2024-03-20 ENCOUNTER — Ambulatory Visit
Admission: RE | Admit: 2024-03-20 | Discharge: 2024-03-20 | Disposition: A | Discharge status: Home / Self Care | Payer: Self-pay | Source: Ambulatory Visit | Attending: Obstetrics and Gynecology | Admitting: Obstetrics and Gynecology

## 2024-03-20 DIAGNOSIS — N6489 Other specified disorders of breast: Secondary | ICD-10-CM

## 2024-06-10 ENCOUNTER — Telehealth: Payer: Self-pay

## 2024-06-10 ENCOUNTER — Telehealth: Payer: Self-pay | Admitting: Oncology

## 2024-06-10 ENCOUNTER — Other Ambulatory Visit: Payer: Self-pay | Admitting: Oncology

## 2024-06-10 DIAGNOSIS — D500 Iron deficiency anemia secondary to blood loss (chronic): Secondary | ICD-10-CM

## 2024-06-10 NOTE — Telephone Encounter (Signed)
 error

## 2024-06-10 NOTE — Telephone Encounter (Signed)
 Followed up on voicemail message from patient requesting a call back. She did not give any details. Reached out to patient via phone call and left a detailed voicemail message requesting that if she calls again and I am away from by phone, for her to leave a detail message as to what she is needing so that I can better help her more time efficiently.

## 2024-06-10 NOTE — Telephone Encounter (Signed)
 Spoke with patient via phone call and agreed that a scheduler would reach out to her to schedule a hematology appointment with labs. Dr. Autumn agrees.

## 2024-06-10 NOTE — Telephone Encounter (Signed)
 I left voicemail for patient regarding scheduled lab/MD appointments on 06/18/2024. I advised patient to give me a call back if date/time does not work for her.

## 2024-06-18 ENCOUNTER — Encounter: Payer: Self-pay | Admitting: Oncology

## 2024-06-18 ENCOUNTER — Inpatient Hospital Stay: Admitting: Oncology

## 2024-06-18 ENCOUNTER — Inpatient Hospital Stay: Attending: Oncology

## 2024-06-18 VITALS — BP 99/68 | HR 80 | Temp 98.5°F | Resp 16 | Ht 65.5 in | Wt 217.0 lb

## 2024-06-18 DIAGNOSIS — D500 Iron deficiency anemia secondary to blood loss (chronic): Secondary | ICD-10-CM

## 2024-06-18 LAB — CBC WITH DIFFERENTIAL (CANCER CENTER ONLY)
Abs Immature Granulocytes: 0.02 K/uL (ref 0.00–0.07)
Basophils Absolute: 0 K/uL (ref 0.0–0.1)
Basophils Relative: 1 %
Eosinophils Absolute: 0.4 K/uL (ref 0.0–0.5)
Eosinophils Relative: 6 %
HCT: 31.5 % — ABNORMAL LOW (ref 36.0–46.0)
Hemoglobin: 9.9 g/dL — ABNORMAL LOW (ref 12.0–15.0)
Immature Granulocytes: 0 %
Lymphocytes Relative: 42 %
Lymphs Abs: 2.5 K/uL (ref 0.7–4.0)
MCH: 27.4 pg (ref 26.0–34.0)
MCHC: 31.4 g/dL (ref 30.0–36.0)
MCV: 87.3 fL (ref 80.0–100.0)
Monocytes Absolute: 0.5 K/uL (ref 0.1–1.0)
Monocytes Relative: 8 %
Neutro Abs: 2.6 K/uL (ref 1.7–7.7)
Neutrophils Relative %: 43 %
Platelet Count: 305 K/uL (ref 150–400)
RBC: 3.61 MIL/uL — ABNORMAL LOW (ref 3.87–5.11)
RDW: 18.6 % — ABNORMAL HIGH (ref 11.5–15.5)
WBC Count: 6 K/uL (ref 4.0–10.5)
nRBC: 0 % (ref 0.0–0.2)

## 2024-06-18 LAB — FERRITIN: Ferritin: 91 ng/mL (ref 11–307)

## 2024-06-18 LAB — IRON AND IRON BINDING CAPACITY (CC-WL,HP ONLY)
Iron: 281 ug/dL — ABNORMAL HIGH (ref 28–170)
Saturation Ratios: 69 % — ABNORMAL HIGH (ref 10.4–31.8)
TIBC: 406 ug/dL (ref 250–450)
UIBC: 125 ug/dL

## 2024-06-18 NOTE — Progress Notes (Signed)
 [facility]  HEMATOLOGY CLINIC PROGRESS NOTE  PATIENT NAME: Erika King   MR#: [id number] DOB: 02/04/1981  Patient Care Team: Delayne Artist PARAS, MD as PCP - General (Family Medicine)  Date of visit: 06/18/2024   ASSESSMENT & PLAN:   Erika King is a 43 y.o. lady who works as a publishing rights manager, with a past medical history of rheumatoid arthritis, uterine fibroids, menorrhagia, was referred to our service in November 2024 for evaluation of anemia. Workup consistent with iron deficiency anemia, secondary to menorrhagia.   Iron deficiency anemia due to chronic blood loss On 04/09/2023, labs PCPs office showed hemoglobin of 9.4, hematocrit 30.8, MCV 83.4.  White count and platelet count are within normal limits.  Iron studies on 05/03/2023 showed iron deficiency with ferritin of 7, iron saturation decreased at 6%.  Hence she was referred to us  for consideration of IV iron.   She denies recent chest pain on exertion, shortness of breath on minimal exertion, pre-syncopal episodes, or palpitations.   She had not noticed any recent bleeding such as epistaxis, hematuria or hematochezia   She had no prior history or diagnosis of cancer. Her age appropriate screening programs are up-to-date.  On her consultation with us  on 06/07/2023, labs showed persistent anemia with hemoglobin of 10, hematocrit 31.2, MCV 85.5.  White count complement were within normal limits.  Iron studies showed iron deficiency with iron saturation of 8%.  Ferritin was borderline low at 31.  We planned for IV iron infusions but apparently patient did not get notification about the appointments and never received them.  She presented to our clinic to reestablish care on 06/18/2024.  She has been taking oral iron supplements once daily.  Labs showed hemoglobin of 9.9, MCV 87.3.  Ferritin was 91, iron studies showed no evidence of iron deficiency.  Patient opted to continue oral iron supplements for  now.  In case we had to proceed with IV iron, her insurance approved Venofer.  -Plan to recheck labs and follow-up in 3 months.  Constipation secondary to iron supplementation Constipation associated with oral iron supplementation. Managed with magnesium supplementation. Discussed potential use of vitamin C to enhance iron absorption and reduce constipation. - Continue magnesium supplementation to manage constipation. - Consider adding vitamin C to enhance iron absorption and potentially reduce constipation.  Rheumatoid arthritis in remission Rheumatoid arthritis currently in remission with no active symptoms. Discussed potential impact of autoimmune status on insurance approval for IV iron infusion.  I spent a total of 23 minutes during this encounter with the patient including review of chart and various tests results, discussions about plan of care and coordination of care plan.  I reviewed lab results and outside records for this visit and discussed relevant results with the patient. Diagnosis, plan of care and treatment options were also discussed in detail with the patient. Opportunity provided to ask questions and answers provided to her apparent satisfaction. Provided instructions to call our clinic with any problems, questions or concerns prior to return visit. I recommended to continue follow-up with PCP and sub-specialists. She verbalized understanding and agreed with the plan. No barriers to learning was detected.  Chinita Patten, MD  06/18/2024 5:37 PM  [facility] - A DEPT OF [facility] [address] [place] [address] Dept: [phone] Dept Fax: [phone]   CHIEF COMPLAINT/ REASON FOR VISIT:  Follow up for iron deficiency anemia, secondary to menorrhagia.  INTERVAL HISTORY:  Discussed the use of AI scribe software for clinical note transcription with the patient, who gave verbal consent to  proceed.  History of Present Illness Erika King is a 43 year old female who presents for follow-up and management of heavy menstrual bleeding and iron deficiency anemia.  She experiences heavy menstrual bleeding, particularly on the second and third days of her cycle, requiring frequent changes every hour or less. She uses tranexamic acid intermittently, taking two tablets twice a day during her cycle, which significantly reduces the flow and shortens the duration of her period. Without the medication, she describes the bleeding as 'a big puddle' when standing up after sitting.  Her hemoglobin level is currently 9.9 g/dL, up from 9.1 g/dL two weeks ago after starting iron supplements. Her normal hemoglobin level is around 12 g/dL. She has been taking iron pills for about two weeks, which has slightly improved her hemoglobin levels. She experiences constipation as a side effect of the iron supplements and manages it with magnesium. She is awaiting ferritin results to assess her iron stores.  Her past medical history includes rheumatoid arthritis, which is currently in remission with no active symptoms. She has no other bleeding issues, such as dark stools or easy bruising.  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 She is busy with work and sits most of the day.    SUMMARY OF HEMATOLOGIC HISTORY:  She reports history of fibroids and heavy menstrual periods. She has been taking iron supplements inconsistently due to constipation. The patient believes her anemia is due to her heavy menstrual periods, which often include large clots. She has had a myomectomy in the past and is considering further treatment options for her fibroids. The patient also reports a swelling in her neck, which has been evaluated with an ultrasound and deemed benign. She is currently taking Enbrel  for rheumatoid arthritis.   On 04/09/2023, labs  PCPs office showed hemoglobin of 9.4, hematocrit 30.8, MCV 83.4.  White count and platelet count are within normal limits.  Iron studies on 05/03/2023 showed iron deficiency with ferritin of 7, iron saturation decreased at 6%.  Hence she was referred to us  for consideration of IV iron.   She denies recent chest pain on exertion, shortness of breath on minimal exertion, pre-syncopal episodes, or palpitations.   She had not noticed any recent bleeding such as epistaxis, hematuria or hematochezia   She had no prior history or diagnosis of cancer. Her age appropriate screening programs are up-to-date.  On her consultation with us  on 06/07/2023, labs showed persistent anemia with hemoglobin of 10, hematocrit 31.2, MCV 85.5.  White count complement were within normal limits.  Iron studies showed iron deficiency with iron saturation of 8%.  Ferritin was borderline low at 31.  We planned for IV iron infusions but apparently patient did not get notification about the appointments and never received them.  She presented to our clinic to reestablish care on 06/18/2024.  She has been taking oral iron supplements once daily.  Labs showed hemoglobin of 9.9, MCV 87.3.  Ferritin was 91, iron studies showed no evidence of iron deficiency.  Patient opted to continue oral iron supplements for now.  I have reviewed the past medical history, past surgical history, social history and family history with the patient and they are unchanged from previous note.  ALLERGIES: She is allergic to bee pollen, black walnut pollen allergy skin  test, cat dander, dog epithelium, molds & smuts, and other.  MEDICATIONS:  Current Outpatient Medications  Medication Sig Dispense Refill   albuterol  (PROVENTIL  HFA;VENTOLIN  HFA) 108 (90 Base) MCG/ACT inhaler Inhale 1-2 puffs into the lungs every 6 (six) hours as needed for wheezing or shortness of breath.     Butalbital-APAP-Caffeine 50-325-40 MG capsule 1 capsule as needed Orally every 4  hrs     Calcium-Magnesium-Vitamin D  (CALCIUM 500 PO) Take 1 tablet by mouth every morning. (Patient taking differently: Take 1 tablet by mouth every morning.)     cetirizine (ZYRTEC) 10 MG tablet Take 5 mg by mouth as needed for allergies.     Cholecalciferol 50 MCG (2000 UT) CAPS 2 capsules Orally once a month     ELDERBERRY PO Orally     etanercept  (ENBREL  SURECLICK) 50 MG/ML injection Inject 50 mg into the skin every 14 (fourteen) days. 4 mL 0   famotidine (PEPCID) 20 MG tablet 1 tablet at bedtime as needed Orally Once a day for 30 day(s)     ferrous sulfate 325 (65 FE) MG tablet Take 325 mg by mouth daily with breakfast. (Patient taking differently: Take 325 mg by mouth daily with breakfast. Taking iron once or twice per month)     fluticasone (FLONASE) 50 MCG/ACT nasal spray Place 1 spray into both nostrils daily as needed for allergies or rhinitis.     levothyroxine  (SYNTHROID ) 175 MCG tablet Take 175 mcg by mouth every morning.     Magnesium 250 MG TABS Take 1 tablet by mouth as needed (Take with iron to help prevent constipation).     montelukast (SINGULAIR) 10 MG tablet take 1 tablet by mouth daily as needed for allergies  2   Nutritional Supplements (CHRONO-IMMUNE SHIELD PO) Take 1 tablet by mouth as needed (when feeling under the weather).      tranexamic acid (LYSTEDA) 650 MG TABS tablet Take 1,300 mg by mouth every 8 (eight) hours as needed.     No current facility-administered medications for this visit.     REVIEW OF SYSTEMS:    Review of Systems - Oncology  All other pertinent systems were reviewed with the patient and are negative.  PHYSICAL EXAMINATION:   Onc Performance Status - 06/18/24 0848       KPS SCALE   KPS % SCORE Able to carry on normal activity, minor s/s of disease          Vitals:   06/18/24 0835  BP: 99/68  Pulse: 80  Resp: 16  Temp: 98.5 F (36.9 C)  SpO2: 99%   Filed Weights   06/18/24 0835  Weight: 217 lb (98.4 kg)    Physical  Exam Constitutional:      General: She is not in acute distress.    Appearance: Normal appearance.  HENT:     Head: Normocephalic and atraumatic.  Cardiovascular:     Rate and Rhythm: Normal rate.  Pulmonary:     Effort: Pulmonary effort is normal. No respiratory distress.  Abdominal:     General: There is no distension.  Neurological:     General: No focal deficit present.     Mental Status: She is alert and oriented to person, place, and time.  Psychiatric:        Mood and Affect: Mood normal.        Behavior: Behavior normal.     LABORATORY DATA:   I have reviewed the data as listed.  Results for orders placed or  performed in visit on 06/18/24  CBC with Differential (Cancer Center Only)  Result Value Ref Range   WBC Count 6.0 4.0 - 10.5 K/uL   RBC 3.61 (L) 3.87 - 5.11 MIL/uL   Hemoglobin 9.9 (L) 12.0 - 15.0 g/dL   HCT 68.4 (L) 63.9 - 53.9 %   MCV 87.3 80.0 - 100.0 fL   MCH 27.4 26.0 - 34.0 pg   MCHC 31.4 30.0 - 36.0 g/dL   RDW 81.3 (H) 88.4 - 84.4 %   Platelet Count 305 150 - 400 K/uL   nRBC 0.0 0.0 - 0.2 %   Neutrophils Relative % 43 %   Neutro Abs 2.6 1.7 - 7.7 K/uL   Lymphocytes Relative 42 %   Lymphs Abs 2.5 0.7 - 4.0 K/uL   Monocytes Relative 8 %   Monocytes Absolute 0.5 0.1 - 1.0 K/uL   Eosinophils Relative 6 %   Eosinophils Absolute 0.4 0.0 - 0.5 K/uL   Basophils Relative 1 %   Basophils Absolute 0.0 0.0 - 0.1 K/uL   Immature Granulocytes 0 %   Abs Immature Granulocytes 0.02 0.00 - 0.07 K/uL  Iron and Iron Binding Capacity ([facility] only)  Result Value Ref Range   Iron 281 (H) 28 - 170 ug/dL   TIBC 593 749 - 549 ug/dL   Saturation Ratios 69 (H) 10.4 - 31.8 %   UIBC 125 ug/dL  Ferritin  Result Value Ref Range   Ferritin 91 11 - 307 ng/mL    RADIOGRAPHIC STUDIES:  I have personally reviewed the radiological images as listed and agree with the findings in the report.  MM 3D DIAGNOSTIC MAMMOGRAM BILATERAL BREAST CLINICAL DATA:  43 year old  female here for 1 year follow-up of probably benign asymmetry in the inferior right breast.  EXAM: DIGITAL DIAGNOSTIC BILATERAL MAMMOGRAM WITH TOMOSYNTHESIS AND CAD  TECHNIQUE: Bilateral digital diagnostic mammography and breast tomosynthesis was performed. The images were evaluated with computer-aided detection.  COMPARISON:  Previous exam(s).  ACR Breast Density Category b: There are scattered areas of fibroglandular density.  FINDINGS: There are no suspicious masses, microcalcifications, architectural distortion or other suspicious findings seen in either breast. Area of concern on prior mammogram in the inferior right breast appears unchanged compared to prior exam from 2021. No findings concerning for malignancy.  IMPRESSION: No mammographic evidence of malignancy.  RECOMMENDATION: Routine annual screening mammogram in 1 year.  I have discussed the findings and recommendations with the patient. If applicable, a reminder letter will be sent to the patient regarding the next appointment.  BI-RADS CATEGORY  1: Negative.  Electronically Signed   By: Rosina Gelineau M.D.   On: 03/20/2024 15:28   Orders Placed This Encounter  Procedures   CBC with Differential (Cancer Center Only)    Standing Status:   Future    Expected Date:   09/18/2024    Expiration Date:   12/17/2024   Iron and Iron Binding Capacity ([facility] only)    Standing Status:   Future    Expected Date:   09/18/2024    Expiration Date:   12/17/2024   Ferritin    Standing Status:   Future    Expected Date:   09/18/2024    Expiration Date:   12/17/2024     Future Appointments  Date Time Provider Department Center  09/18/2024 11:00 AM [facility] None  09/18/2024 11:30 AM Deshia Vanderhoof, Chinita, MD [facility] None     This document was completed utilizing speech recognition software. Grammatical errors, random  word insertions, pronoun errors, and incomplete sentences are an occasional  consequence of this system due to software limitations, ambient noise, and hardware issues. Any formal questions or concerns about the content, text or information contained within the body of this dictation should be directly addressed to the provider for clarification.

## 2024-06-18 NOTE — Assessment & Plan Note (Signed)
 On 04/09/2023, labs PCPs office showed hemoglobin of 9.4, hematocrit 30.8, MCV 83.4.  White count and platelet count are within normal limits.  Iron studies on 05/03/2023 showed iron deficiency with ferritin of 7, iron saturation decreased at 6%.  Hence she was referred to us  for consideration of IV iron.   She denies recent chest pain on exertion, shortness of breath on minimal exertion, pre-syncopal episodes, or palpitations.   She had not noticed any recent bleeding such as epistaxis, hematuria or hematochezia   She had no prior history or diagnosis of cancer. Her age appropriate screening programs are up-to-date.  On her consultation with us  on 06/07/2023, labs showed persistent anemia with hemoglobin of 10, hematocrit 31.2, MCV 85.5.  White count complement were within normal limits.  Iron studies showed iron deficiency with iron saturation of 8%.  Ferritin was borderline low at 31.  We planned for IV iron infusions but apparently patient did not get notification about the appointments and never received them.  She presented to our clinic to reestablish care on 06/18/2024.  She has been taking oral iron supplements once daily.  Labs showed hemoglobin of 9.9, MCV 87.3.  Ferritin was 91, iron studies showed no evidence of iron deficiency.  Patient opted to continue oral iron supplements for now.  In case we had to proceed with IV iron, her insurance approved Venofer.  -Plan to recheck labs and follow-up in 3 months.

## 2024-09-18 ENCOUNTER — Inpatient Hospital Stay

## 2024-09-18 ENCOUNTER — Inpatient Hospital Stay: Admitting: Oncology
